# Patient Record
Sex: Male | Born: 1975 | Race: White | Hispanic: No | Marital: Married | Smoking: Former smoker
Health system: Southern US, Community
[De-identification: ages and names within clinical notes are randomized; demographics above are authoritative.]

## PROBLEM LIST (undated history)

## (undated) DIAGNOSIS — N529 Male erectile dysfunction, unspecified: Secondary | ICD-10-CM

## (undated) DIAGNOSIS — IMO0001 Reserved for inherently not codable concepts without codable children: Secondary | ICD-10-CM

## (undated) DIAGNOSIS — N400 Enlarged prostate without lower urinary tract symptoms: Secondary | ICD-10-CM

## (undated) DIAGNOSIS — R030 Elevated blood-pressure reading, without diagnosis of hypertension: Secondary | ICD-10-CM

## (undated) DIAGNOSIS — E291 Testicular hypofunction: Secondary | ICD-10-CM

## (undated) DIAGNOSIS — E663 Overweight: Secondary | ICD-10-CM

## (undated) HISTORY — PX: KNEE ARTHROSCOPY: SUR90

## (undated) HISTORY — DX: Elevated blood-pressure reading, without diagnosis of hypertension: R03.0

## (undated) HISTORY — DX: Overweight: E66.3

## (undated) HISTORY — DX: Reserved for inherently not codable concepts without codable children: IMO0001

## (undated) HISTORY — DX: Benign prostatic hyperplasia without lower urinary tract symptoms: N40.0

## (undated) HISTORY — DX: Testicular hypofunction: E29.1

## (undated) HISTORY — DX: Male erectile dysfunction, unspecified: N52.9

## (undated) HISTORY — PX: CHOLECYSTECTOMY: SHX55

## (undated) HISTORY — PX: OTHER SURGICAL HISTORY: SHX169

---

## 1999-02-13 ENCOUNTER — Emergency Department (HOSPITAL_COMMUNITY): Admission: EM | Admit: 1999-02-13 | Discharge: 1999-02-13 | Payer: Self-pay | Admitting: Emergency Medicine

## 1999-02-19 ENCOUNTER — Emergency Department (HOSPITAL_COMMUNITY): Admission: EM | Admit: 1999-02-19 | Discharge: 1999-02-19 | Payer: Self-pay | Admitting: Emergency Medicine

## 2010-01-19 ENCOUNTER — Emergency Department: Payer: Self-pay | Admitting: Emergency Medicine

## 2011-06-22 DIAGNOSIS — I82409 Acute embolism and thrombosis of unspecified deep veins of unspecified lower extremity: Secondary | ICD-10-CM

## 2011-06-22 HISTORY — DX: Acute embolism and thrombosis of unspecified deep veins of unspecified lower extremity: I82.409

## 2011-12-17 LAB — COMPREHENSIVE METABOLIC PANEL
Albumin: 4 g/dL (ref 3.4–5.0)
Alkaline Phosphatase: 51 U/L (ref 50–136)
Anion Gap: 6 — ABNORMAL LOW (ref 7–16)
BUN: 26 mg/dL — ABNORMAL HIGH (ref 7–18)
Bilirubin,Total: 0.3 mg/dL (ref 0.2–1.0)
Co2: 31 mmol/L (ref 21–32)
Creatinine: 1.64 mg/dL — ABNORMAL HIGH (ref 0.60–1.30)
EGFR (African American): >60
EGFR (Non-African Amer.): 53 — ABNORMAL LOW
Glucose: 101 mg/dL — ABNORMAL HIGH (ref 65–99)
Osmolality: 284 (ref 275–301)
SGOT(AST): 30 U/L (ref 15–37)
Sodium: 140 mmol/L (ref 136–145)
Total Protein: 7.3 g/dL (ref 6.4–8.2)

## 2011-12-17 LAB — URINALYSIS, COMPLETE
Bacteria: NONE SEEN
Bilirubin,UR: NEGATIVE
Blood: NEGATIVE
Glucose,UR: NEGATIVE mg/dL (ref 0–75)
Ketone: NEGATIVE
Leukocyte Esterase: NEGATIVE
Nitrite: NEGATIVE
Ph: 5 (ref 4.5–8.0)
RBC,UR: NONE SEEN /HPF (ref 0–5)
Specific Gravity: 1.029 (ref 1.003–1.030)
Squamous Epithelial: NONE SEEN
WBC UR: NONE SEEN /HPF (ref 0–5)

## 2011-12-17 LAB — CBC
HCT: 51 % (ref 40.0–52.0)
MCH: 30.6 pg (ref 26.0–34.0)
MCHC: 34 g/dL (ref 32.0–36.0)
Platelet: 196 10*3/uL (ref 150–440)
RBC: 5.67 10*6/uL (ref 4.40–5.90)
RDW: 12.7 % (ref 11.5–14.5)

## 2011-12-17 LAB — LIPASE, BLOOD: Lipase: 384 U/L (ref 73–393)

## 2011-12-18 ENCOUNTER — Observation Stay: Payer: Self-pay | Admitting: Surgery

## 2011-12-21 LAB — PATHOLOGY REPORT

## 2011-12-24 ENCOUNTER — Observation Stay: Payer: Self-pay | Admitting: Surgery

## 2011-12-24 LAB — BASIC METABOLIC PANEL
Chloride: 103 mmol/L (ref 98–107)
Co2: 28 mmol/L (ref 21–32)
Creatinine: 1.54 mg/dL — ABNORMAL HIGH (ref 0.60–1.30)
EGFR (Non-African Amer.): 57 — ABNORMAL LOW
Glucose: 211 mg/dL — ABNORMAL HIGH (ref 65–99)
Osmolality: 285 (ref 275–301)
Potassium: 4.1 mmol/L (ref 3.5–5.1)
Sodium: 139 mmol/L (ref 136–145)

## 2011-12-24 LAB — CBC WITH DIFFERENTIAL/PLATELET
Basophil #: 0.1 10*3/uL (ref 0.0–0.1)
Eosinophil %: 3.5 %
HCT: 43.8 % (ref 40.0–52.0)
HGB: 14.3 g/dL (ref 13.0–18.0)
Lymphocyte %: 24.9 %
Monocyte %: 10.5 %
Neutrophil #: 4.2 10*3/uL (ref 1.4–6.5)
Platelet: 167 10*3/uL (ref 150–440)
RBC: 4.67 10*6/uL (ref 4.40–5.90)
RDW: 12.4 % (ref 11.5–14.5)
WBC: 6.9 10*3/uL (ref 3.8–10.6)

## 2011-12-25 LAB — PROTIME-INR
INR: 1.2
Prothrombin Time: 15.5 secs — ABNORMAL HIGH (ref 11.5–14.7)

## 2012-01-26 ENCOUNTER — Ambulatory Visit: Payer: Self-pay | Admitting: Surgery

## 2012-11-15 ENCOUNTER — Ambulatory Visit: Payer: Self-pay

## 2014-10-13 NOTE — H&P (Signed)
History of Present Illness 1 1/2 year h/o RUQ pain associated with nausea but no vomiting. Postprandial episodes about every other week, typically last about 30 minutes each. No fever or jaundice or radiation to the pain. Last two days has hgad two episodes each lasting > 3 hours and more severe.   Past Med/Surgical Hx:  Denies:   Right Knee Arthroscopy:   ALLERGIES:  No Known Allergies:   HOME MEDICATIONS: Medication Status  Denies Active   Family and Social History:   Family History Non-Contributory    Social History negative tobacco, negative ETOH    Place of Living Home  married with kids, works as Warden/ranger   Review of Systems:   Fever/Chills No    Cough No    Sputum No    Abdominal Pain Yes    Diarrhea No    Constipation No    Nausea/Vomiting Yes    SOB/DOE No    Chest Pain No    Dysuria No    Tolerating PT Yes    Tolerating Diet No  Nauseated   Physical Exam:   GEN well developed, well nourished, no acute distress    HEENT pink conjunctivae, PERRL, hearing intact to voice, Oropharynx clear, good dentition    NECK supple    RESP normal resp effort  clear BS    CARD regular rate  no JVD  no Rub    ABD denies tenderness  normal BS    EXTR negative edema    SKIN normal to palpation, skin turgor good    NEURO follows commands, motor/sensory function intact    PSYCH A+O to time, place, person, good insight   Lab Results: Hepatic:  28-Jun-13 19:18    Bilirubin, Total 0.3   Alkaline Phosphatase 51   SGPT (ALT)  93 (12-78 NOTE: NEW REFERENCE RANGE 05/14/2011)   SGOT (AST) 30   Total Protein, Serum 7.3   Albumin, Serum 4.0  Routine Chem:  28-Jun-13 19:18    Lipase 384 (Result(s) reported on 17 Dec 2011 at 08:44PM.)   Glucose, Serum  101   BUN  26   Creatinine (comp)  1.64   Sodium, Serum 140   Potassium, Serum 4.2   Chloride, Serum 103   CO2, Serum 31   Calcium (Total), Serum 9.0   Osmolality (calc) 284    eGFR (African American) >60   eGFR (Non-African American)  53 (eGFR values <67mL/min/1.73 m2 may be an indication of chronic kidney disease (CKD). Calculated eGFR is useful in patients with stable renal function. The eGFR calculation will not be reliable in acutely ill patients when serum creatinine is changing rapidly. It is not useful in  patients on dialysis. The eGFR calculation may not be applicable to patients at the low and high extremes of body sizes, pregnant women, and vegetarians.)   Anion Gap  6  Routine UA:  28-Jun-13 19:18    Color (UA) Yellow   Clarity (UA) Hazy   Glucose (UA) Negative   Bilirubin (UA) Negative   Ketones (UA) Negative   Specific Gravity (UA) 1.029   Blood (UA) Negative   pH (UA) 5.0   Protein (UA) Negative   Nitrite (UA) Negative   Leukocyte Esterase (UA) Negative (Result(s) reported on 17 Dec 2011 at 07:49PM.)   RBC (UA) NONE SEEN   WBC (UA) NONE SEEN   Bacteria (UA) NONE SEEN   Epithelial Cells (UA) NONE SEEN   Mucous (UA) PRESENT (Result(s) reported on  17 Dec 2011 at 07:49PM.)  Routine Hem:  28-Jun-13 19:18    WBC (CBC) 7.3   RBC (CBC) 5.67   Hemoglobin (CBC) 17.3   Hematocrit (CBC) 51.0   Platelet Count (CBC) 196 (Result(s) reported on 17 Dec 2011 at 08:42PM.)   MCV 90   MCH 30.6   MCHC 34.0   RDW 12.7     Assessment/Admission Diagnosis U/S - GB wall thick, stones, pericholecystic fluid  Dehydration    Plan IVF ABx Lap CCY   Electronic Signatures: Consuela Mimes (MD)  (Signed 29-Jun-13 00:50)  Authored: CHIEF COMPLAINT and HISTORY, PAST MEDICAL/SURGIAL HISTORY, ALLERGIES, HOME MEDICATIONS, FAMILY AND SOCIAL HISTORY, REVIEW OF SYSTEMS, PHYSICAL EXAM, LABS, ASSESSMENT AND PLAN   Last Updated: 29-Jun-13 00:50 by Consuela Mimes (MD)

## 2014-10-13 NOTE — Op Note (Signed)
PATIENT NAME:  James Hebert, James Hebert MR#:  [id number] DATE OF BIRTH:  06-Jun-1976  DATE OF PROCEDURE:  12/19/2011  PREOPERATIVE DIAGNOSIS: Acute calculus cholecystitis.   POSTOPERATIVE DIAGNOSIS: Acute calculus cholecystitis.  PROCEDURE PERFORMED: Laparoscopic cholecystectomy.   SURGEON: Raynald KempMark A Billyjack Trompeter, M.D.   ASSISTANT: Surgical scrub technologist x2.  TYPE OF ANESTHESIA: General endotracheal, [facility].   INDICATIONS: This is an obese 39 year old white male with a 1-1/2 year history of intermittent biliary colic. Over the last two days, the patient has had persistent right upper quadrant and epigastric abdominal pain. Liver function tests were normal. Gallbladder ultrasound demonstrated pericholecystic fluid, gallbladder wall of 4 mm, and stones. The patient was admitted to the hospital, hydrated, and started on intravenous antibiotics. Laparoscopic cholecystectomy was entertained with the family and they agreed. I discussed with them in the holding area the risks of surgery including that of bleeding, infection, bile duct injury, leak, need for conversion to open operation, and future ERCP. All their questions were answered.   FINDINGS: Hydrops and stones.   SPECIMENS: Stones and gallbladder.   DESCRIPTION OF PROCEDURE: With the patient in the supine position, general oral endotracheal anesthesia was induced. The patient's abdomen was clipped of hair, prepped and draped with ChloraPrep solution. Time out was observed. An infraumbilical transversely oriented skin incision was fashioned with a scalpel and carried down with sharp dissection to the fascia. The fascia was incised in the midline and elevated with Kocher clamps. The peritoneum was entered bluntly. An 0 Vicryl U-stitch was passed on either side of the abdominal midline fascia securing a 12 mm blunt Hassan trocar placed under direct visualization. Pneumoperitoneum was established. The patient had acute cholecystitis. The  patient was then positioned in reverse Trendelenburg and airplane right side up. A 5 mm Bladeless trocar was placed through a small incision in the epigastric region. Two 5 mm ports were placed in the right subcostal margin. Utilizing a needle aspiration cannula, the gallbladder was decompressed of clear bile. A grasper was placed on the fundus of the gallbladder and elevated towards the right shoulder. Due to the patient's obesity, an additional 5 mm trocar was placed in the right midabdomen through which the peer retractor was used to retract omentum and colon inferiorly. Lateral traction was placed on Hartmann's pouch. The hepatoduodenal ligament was then incised with blunt technique and point cautery at bleeding points on the peritoneum. A critical view of safety cholecystectomy was performed with clear identification of the gallbladder cystic duct junction. A single cystic artery was identified with a small posterior branch. Three 10 mm clips were placed on the portal side of the cystic duct, one clip on the gallbladder side, and the structure was then divided. Immediately adjacent the cystic artery was identified and similarly ligated. The gallbladder was then retrieved off the gallbladder fossa utilizing hook cautery apparatus and countertraction. It was placed into an Endo Catch device and retrieved. The right upper quadrant was irrigated with a total of 2 liters of normal saline during the operation and aspirated dry. Point hemostasis in the gallbladder fossa was obtained with point cautery and the application of Endo Avitene. With hemostasis being ensured on the operative field, ports were then removed under direct visualization with hemostasis being confirmed and the infraumbilical fascial defect was closed with multiple simple and figure-of-eight 0 Vicryl sutures in vertical orientation. The existing stay sutures were tied to each other. A total of 30 mL of 0.25% plain Marcaine was infiltrated along all  skin and fascial incisions prior to closure.   4-0 Vicryl subcuticular was used to reapproximate skin edges followed by the application of benzoin, Steri-Strips, Telfa, and Tegaderm. The patient then subsequently taken to the recovery room in stable and satisfactory condition by anesthesia services.  ____________________________ Redge Gainer Egbert Garibaldi, MD mab:slb D: 12/19/2011 11:31:51 ET T: 12/19/2011 12:27:21 ET JOB#: [id number]  cc: Loraine Leriche A. Egbert Garibaldi, MD, <Dictator> Raynald Kemp MD ELECTRONICALLY SIGNED 12/21/2011 9:48

## 2014-10-13 NOTE — H&P (Signed)
PATIENT NAME:  James Hebert, [name] MR#:  [id number] DATE OF BIRTH:  [birth year]  DATE OF ADMISSION:  12/24/2011  ADMITTING PHYSICIAN: Dr. Michela PitcherEly  CHIEF COMPLAINT: Left leg pain.   BRIEF HISTORY: Mr. James Hebert is a 39 year old gentleman five days status post laparoscopic cholecystectomy for acute cholecystitis. He had an uncomplicated hospitalization. He noted immediately following surgery that he had some left calf discomfort. He was combative during Emergency Room anesthesia but did not have any documented trauma. The calf continued to bother him. Over the last several days has become increasingly uncomfortable, particularly at rest. He denies any swelling. Called the office today and ultrasound of his left lower extremity was arranged. He did have a posterior tibial deep vein thrombosis all of which appear to be infrapopliteal. Because of his symptoms and the possibility of postsurgical thrombosis he was admitted to the hospital for anticoagulation and further evaluation.   FAMILY HISTORY: Noncontributory.   SOCIAL HISTORY: He does not smoke cigarettes or drink alcohol. He lives with his wife. Works as an Dance movement psychotherapistelevator inspector.   REVIEW OF SYSTEMS: Unremarkable, well outlined in his previous admission note.   PHYSICAL EXAMINATION:  GENERAL: He is an alert, pleasant gentleman, no significant distress.   VITAL SIGNS: His vital signs are stable with blood pressure 148/78, heart rate 84 and regular, oxygen saturation 96% on room air.   HEENT: No scleral icterus and normal pupillary reflexes. He does not have any facial deformity.   NECK: Supple without adenopathy.   CHEST: Clear with no adventitious sounds. He has normal pulmonary excursion.   CARDIAC: No murmurs or gallops to my ear. He seems to be in normal sinus rhythm.   ABDOMEN: Benign with good well healed scars. He has some mild bruising. He has no rebound or guarding and has no abdominal distention.   EXTREMITIES: Lower extremity exam  reveals no swelling but some obvious deep tenderness in the left calf. There is no bruising. He has a negative Homan's sign. He has good distal pulses.   PSYCHIATRIC: Psychiatric exam reveals no depression and no orientation problems.   IMPRESSION: This gentleman appears to have a deep vein thrombosis of his left lower extremity. All of the clot appears to be infrapopliteal. In some situations I would not recommend anticoagulation but since he is postop it does appear to be significantly symptomatic and does have evidence of multiple clot. We will go on an anticoagulate him. I did discuss the possibility of a filter with the vascular surgery service. They felt with infrapopliteal clot that a filter was not necessary. Will plan to anticoagulate him. Follow him as an outpatient. This plan has been discussed with the patient and his wife and they are in agreement.    ____________________________ Carmie Endalph L. Ely III, MD rle:cms D: 12/24/2011 16:57:37 ET T: 12/24/2011 17:24:18 ET JOB#: [id number]  cc: Quentin Orealph L. Ely III, MD, <Dictator> Quentin OreALPH L ELY MD ELECTRONICALLY SIGNED 12/25/2011 9:55

## 2014-10-13 NOTE — Discharge Summary (Signed)
PATIENT NAME:  James Hebert, James Hebert MR#:  [id number] DATE OF BIRTH:  1976-01-05  DATE OF ADMISSION:  12/24/2011 DATE OF DISCHARGE:  12/25/2011  BRIEF HISTORY: [name] is a 39 year old gentleman five days status post laparoscopic cholecystectomy for acute cholecystitis. Immediately following surgery he noted onset of left calf pain which had increased over several days at home. The pain intensified to the point that he sought attention. He presented to the office for further evaluation. There was no significant swelling but there was point tenderness and negative Homans sign. Lower extremity Doppler venous study was performed which did demonstrate some infrapopliteal clot in the posterior tibial vein. Because of his symptoms and the fact that the clot appeared to be mobile he was admitted to the hospital and placed on anticoagulation. I discussed anticoagulation options with the vascular surgeons and we elected to proceed with Xarelto. He had one dose of Lovenox and placed on Xarelto 15 mg p.o. b.i.d. with meals, discussed with the pharmacist. They concurred with the dosage. This morning he is up, feeling better with less discomfort and the pain well controlled with narcotics. Will discharge him home today to be followed in the office later this week to be certain his symptoms are improving. We anticipate a 21 day course of Xarelto 15 mg p.o. b.i.d. followed by 20 mg p.o. daily for another 21 days. This plan has been discussed with the patient and his family and they are in agreement. He is also discharged home on Vicodin 5/325, 1 to 2 every six hours p.r.Hebert. pain.   FINAL DISCHARGE DIAGNOSIS: Deep venous thrombosis left lower extremity.  ____________________________ Quentin Orealph L. Ely III, MD rle:cms D: 12/25/2011 14:52:15 ET T: 12/27/2011 12:29:10 ET JOB#: [id number]  cc: Quentin Orealph L. Ely III, MD, <Dictator>  Quentin OreALPH L ELY MD ELECTRONICALLY SIGNED 12/28/2011 16:00

## 2014-11-15 DIAGNOSIS — E349 Endocrine disorder, unspecified: Secondary | ICD-10-CM | POA: Insufficient documentation

## 2014-12-02 ENCOUNTER — Ambulatory Visit (INDEPENDENT_AMBULATORY_CARE_PROVIDER_SITE_OTHER): Payer: BC Managed Care – PPO | Admitting: *Deleted

## 2014-12-02 DIAGNOSIS — E291 Testicular hypofunction: Secondary | ICD-10-CM

## 2014-12-02 MED ORDER — TESTOSTERONE CYPIONATE 200 MG/ML IM SOLN
200.0000 mg | Freq: Once | INTRAMUSCULAR | Status: AC
  Administered 2014-12-02: 200 mg via INTRAMUSCULAR

## 2014-12-02 NOTE — Progress Notes (Signed)
Testosterone IM Injection  Due to Hypogonadism patient is present today for a Testosterone Injection.  Medication: Testosterone Cypionate Dose: 77mL Location: right upper outer buttocks Lot: [id number] Exp:06/2016  Patient tolerated well, no complications were noted  Preformed by: Rupert Stacks, LPN  Follow up: 1 week

## 2014-12-09 ENCOUNTER — Ambulatory Visit (INDEPENDENT_AMBULATORY_CARE_PROVIDER_SITE_OTHER): Payer: BC Managed Care – PPO

## 2014-12-09 DIAGNOSIS — E291 Testicular hypofunction: Secondary | ICD-10-CM

## 2014-12-09 MED ORDER — TESTOSTERONE CYPIONATE 200 MG/ML IM SOLN
200.0000 mg | Freq: Once | INTRAMUSCULAR | Status: AC
  Administered 2014-12-16: 200 mg via INTRAMUSCULAR

## 2014-12-09 NOTE — Progress Notes (Signed)
Testosterone IM Injection  Due to Hypogonadism patient is present today for a Testosterone Injection.  Medication: Testosterone Cypionate Dose: 1mL Location: left upper outer buttocks Lot: [id number] Exp:06/2016  Patient tolerated well, no complications were noted  Preformed by: Chelsea Watkins, LPN  Follow up: 1 week 

## 2014-12-16 ENCOUNTER — Ambulatory Visit (INDEPENDENT_AMBULATORY_CARE_PROVIDER_SITE_OTHER): Payer: BC Managed Care – PPO

## 2014-12-16 ENCOUNTER — Encounter (INDEPENDENT_AMBULATORY_CARE_PROVIDER_SITE_OTHER): Payer: Self-pay

## 2014-12-16 DIAGNOSIS — E291 Testicular hypofunction: Secondary | ICD-10-CM | POA: Diagnosis not present

## 2014-12-16 MED ORDER — TESTOSTERONE CYPIONATE 200 MG/ML IM SOLN
200.0000 mg | Freq: Once | INTRAMUSCULAR | Status: AC
  Administered 2014-12-16: 200 mg via INTRAMUSCULAR

## 2014-12-16 MED ORDER — TESTOSTERONE CYPIONATE 100 MG/ML IM SOLN: 100.0000 mg | Freq: Once | INTRAMUSCULAR | Status: DC

## 2014-12-16 NOTE — Progress Notes (Signed)
Testosterone IM Injection  Due to Hypogonadism patient is present today for a Testosterone Injection.  Medication: Testosterone Cypionate Dose: 1mL Location: left upper outer buttocks Lot: [id number] Exp:06/2016  Patient tolerated well, no complications were noted  Preformed by: Chelsea Watkins, LPN  Follow up: 1 week 

## 2014-12-24 ENCOUNTER — Ambulatory Visit (INDEPENDENT_AMBULATORY_CARE_PROVIDER_SITE_OTHER): Payer: BC Managed Care – PPO

## 2014-12-24 DIAGNOSIS — E291 Testicular hypofunction: Secondary | ICD-10-CM

## 2014-12-24 MED ORDER — TESTOSTERONE CYPIONATE 200 MG/ML IM SOLN
200.0000 mg | Freq: Once | INTRAMUSCULAR | Status: AC
  Administered 2014-12-24: 200 mg via INTRAMUSCULAR

## 2014-12-24 NOTE — Progress Notes (Signed)
Testosterone IM Injection  Due to Hypogonadism patient is present today for a Testosterone Injection.  Medication: Testosterone Cypionate Dose: 1mL Location: right upper outer buttocks Lot: [id number] Exp:06/2016  Patient tolerated well, no complications were noted  Preformed by: Rupert Stackshelsea Watkins, LPN  Follow up: 1 week

## 2014-12-31 ENCOUNTER — Ambulatory Visit: Payer: BC Managed Care – PPO

## 2014-12-31 DIAGNOSIS — E349 Endocrine disorder, unspecified: Secondary | ICD-10-CM

## 2014-12-31 MED ORDER — TESTOSTERONE CYPIONATE 200 MG/ML IM SOLN
200.0000 mg | Freq: Once | INTRAMUSCULAR | Status: AC
Start: 2014-12-31 — End: 2014-12-31
  Administered 2014-12-31: 200 mg via INTRAMUSCULAR

## 2014-12-31 NOTE — Progress Notes (Unsigned)
Testosterone IM Injection  Due to Hypogonadism patient is present today for a Testosterone Injection.  Medication: Testosterone Cypionate Dose:1ML Location: left upper outer buttocks Lot: [id number]  Exp:06/2016  Patient tolerated well, no complications were noted  Preformed by: K.Russell, CMA  Follow up: office visit w/ Carollee HerterShannon for DRE per Carollee HerterShannon message on 11/13/14, pt had blood work done today (Testosterone, PSA, and HCT).

## 2015-01-01 LAB — PSA: Prostate Specific Ag, Serum: 0.5 ng/mL (ref 0.0–4.0)

## 2015-01-01 LAB — TESTOSTERONE: Testosterone: 880 ng/dL (ref 348–1197)

## 2015-01-01 LAB — HEMATOCRIT: Hematocrit: 51.9 % — ABNORMAL HIGH (ref 37.5–51.0)

## 2015-01-06 ENCOUNTER — Telehealth: Payer: Self-pay

## 2015-01-06 NOTE — Telephone Encounter (Signed)
LMOM

## 2015-01-06 NOTE — Telephone Encounter (Signed)
-----   Message from Harle BattiestShannon A McGowan, PA-C sent at 01/05/2015  9:34 AM EDT ----- Patient needs an appointment.

## 2015-01-07 ENCOUNTER — Ambulatory Visit (INDEPENDENT_AMBULATORY_CARE_PROVIDER_SITE_OTHER): Payer: BC Managed Care – PPO

## 2015-01-07 DIAGNOSIS — E291 Testicular hypofunction: Secondary | ICD-10-CM

## 2015-01-07 MED ORDER — TESTOSTERONE CYPIONATE 200 MG/ML IM SOLN
200.0000 mg | Freq: Once | INTRAMUSCULAR | Status: AC
  Administered 2015-01-07: 200 mg via INTRAMUSCULAR

## 2015-01-07 NOTE — Telephone Encounter (Signed)
Pt came in today for injection. Made aware of needing appt. Pt voiced understanding.

## 2015-01-07 NOTE — Progress Notes (Signed)
Testosterone IM Injection  Due to Hypogonadism patient is present today for a Testosterone Injection.  Medication: Testosterone Cypionate Dose: 1mL Location: left upper outer buttocks Lot: [id number] Exp:06/2016  Patient tolerated well, no complications were noted  Preformed by: Rupert Stackshelsea Mussa Groesbeck, LPN  Follow up: 1 week

## 2015-01-13 ENCOUNTER — Telehealth: Payer: Self-pay

## 2015-01-13 ENCOUNTER — Ambulatory Visit (INDEPENDENT_AMBULATORY_CARE_PROVIDER_SITE_OTHER): Payer: BC Managed Care – PPO

## 2015-01-13 DIAGNOSIS — E349 Endocrine disorder, unspecified: Secondary | ICD-10-CM

## 2015-01-13 DIAGNOSIS — E291 Testicular hypofunction: Secondary | ICD-10-CM | POA: Diagnosis not present

## 2015-01-13 MED ORDER — TESTOSTERONE CYPIONATE 200 MG/ML IM SOLN
200.0000 mg | Freq: Once | INTRAMUSCULAR | Status: AC
  Administered 2015-01-13: 200 mg via INTRAMUSCULAR

## 2015-01-13 NOTE — Telephone Encounter (Signed)
Pt came for testosterone injection this a.m. And requested a rx of generic viagra. Please advise.

## 2015-01-13 NOTE — Progress Notes (Signed)
Testosterone IM Injection  Due to Hypogonadism patient is present today for a Testosterone Injection.  Medication: Testosterone Cypionate Dose: 1mL Location: left upper outer buttocks Lot: [id number] Exp:06/2016  Patient tolerated well, no complications were noted  Preformed by: Islay Polanco, LPN  Follow up: 1 week 

## 2015-01-13 NOTE — Telephone Encounter (Signed)
Patient is actually due to see.  He needs an appointment with me and I can address this at that appointment.

## 2015-01-13 NOTE — Telephone Encounter (Signed)
Spoke with pt in reference to medication. Made pt aware he needed an appt. Pt voiced understanding stating he has an appt at the end of August to discuss other urological issues.

## 2015-01-14 ENCOUNTER — Ambulatory Visit: Payer: BC Managed Care – PPO

## 2015-01-20 ENCOUNTER — Ambulatory Visit: Payer: BC Managed Care – PPO

## 2015-01-27 ENCOUNTER — Ambulatory Visit (INDEPENDENT_AMBULATORY_CARE_PROVIDER_SITE_OTHER): Payer: BC Managed Care – PPO | Admitting: *Deleted

## 2015-01-27 DIAGNOSIS — E291 Testicular hypofunction: Secondary | ICD-10-CM | POA: Diagnosis not present

## 2015-01-27 MED ORDER — TESTOSTERONE CYPIONATE 200 MG/ML IM SOLN
200.0000 mg | Freq: Once | INTRAMUSCULAR | Status: AC
  Administered 2015-01-27: 200 mg via INTRAMUSCULAR

## 2015-01-27 NOTE — Progress Notes (Signed)
Testosterone IM Injection  Due to Hypogonadism patient is present today for a Testosterone Injection.  Medication: Testosterone Cypionate Dose: 1 ml Location: left upper outer buttocks Lot: [id number] Exp:06/2016  Patient tolerated well, no complications were noted  Preformed by: Natividad Brood, CMA   Follow up: 1 week

## 2015-02-03 ENCOUNTER — Ambulatory Visit (INDEPENDENT_AMBULATORY_CARE_PROVIDER_SITE_OTHER): Payer: BC Managed Care – PPO

## 2015-02-03 DIAGNOSIS — E291 Testicular hypofunction: Secondary | ICD-10-CM | POA: Diagnosis not present

## 2015-02-03 DIAGNOSIS — E349 Endocrine disorder, unspecified: Secondary | ICD-10-CM

## 2015-02-03 MED ORDER — TESTOSTERONE CYPIONATE 200 MG/ML IM SOLN
200.0000 mg | Freq: Once | INTRAMUSCULAR | Status: AC
  Administered 2015-02-03: 200 mg via INTRAMUSCULAR

## 2015-02-03 NOTE — Progress Notes (Signed)
Testosterone IM Injection  Due to Hypogonadism patient is present today for a Testosterone Injection.  Medication: Testosterone Cypionate Dose: 1mL Location: right upper outer buttocks Lot: [id number] Exp:06/2016  Patient tolerated well, no complications were noted  Preformed by: Chelsea Watkins, LPN  Follow up: 1 week 

## 2015-02-10 ENCOUNTER — Ambulatory Visit: Payer: BC Managed Care – PPO

## 2015-02-11 ENCOUNTER — Encounter: Payer: Self-pay | Admitting: *Deleted

## 2015-02-19 ENCOUNTER — Ambulatory Visit: Payer: BC Managed Care – PPO | Admitting: Urology

## 2015-09-05 ENCOUNTER — Ambulatory Visit (INDEPENDENT_AMBULATORY_CARE_PROVIDER_SITE_OTHER): Payer: BLUE CROSS/BLUE SHIELD | Admitting: Urology

## 2015-09-05 ENCOUNTER — Encounter: Payer: Self-pay | Admitting: Urology

## 2015-09-05 VITALS — BP 154/95 | HR 60 | Temp 98.4°F | Resp 16 | Ht 70.0 in | Wt 256.5 lb

## 2015-09-05 DIAGNOSIS — N400 Enlarged prostate without lower urinary tract symptoms: Secondary | ICD-10-CM | POA: Diagnosis not present

## 2015-09-05 DIAGNOSIS — E291 Testicular hypofunction: Secondary | ICD-10-CM

## 2015-09-05 DIAGNOSIS — N529 Male erectile dysfunction, unspecified: Secondary | ICD-10-CM

## 2015-09-05 DIAGNOSIS — N401 Enlarged prostate with lower urinary tract symptoms: Secondary | ICD-10-CM | POA: Diagnosis not present

## 2015-09-05 DIAGNOSIS — N138 Other obstructive and reflux uropathy: Secondary | ICD-10-CM

## 2015-09-05 DIAGNOSIS — N528 Other male erectile dysfunction: Secondary | ICD-10-CM | POA: Diagnosis not present

## 2015-09-05 MED ORDER — SILDENAFIL CITRATE 20 MG PO TABS: ORAL_TABLET | ORAL | Status: DC

## 2015-09-05 MED ORDER — TESTOSTERONE CYPIONATE 200 MG/ML IM SOLN: 200.0000 mg | INTRAMUSCULAR | Status: DC

## 2015-09-05 MED ORDER — TESTOSTERONE CYPIONATE 200 MG/ML IM SOLN
200.0000 mg | Freq: Once | INTRAMUSCULAR | Status: AC
  Administered 2015-09-05: 200 mg via INTRAMUSCULAR

## 2015-09-05 NOTE — Progress Notes (Signed)
Testosterone IM Injection  Due to Hypogonadism patient is present today for a Testosterone Injection.  Medication: Testosterone Cypionate Dose: 1mL Location: right upper outer buttocks Lot: [id number] Exp:06/2016  Patient tolerated well, no complications were noted  Preformed by: Rupert Stackshelsea Watkins, LPN

## 2015-09-05 NOTE — Progress Notes (Signed)
09/05/2015 10:13 AM   James Hebert [birth year] [id number]  Referring provider: Ignacia Bayleyobert Tumey, PA-C [facility] [facility] [address] [facility] [place], [place] [id number]  Chief Complaint  Patient presents with  . Benign Prostatic Hypertrophy  . Erectile Dysfunction  . Follow-up    HPI: Patient is a 40 year old Caucasian male with hypogonadism, erectile dysfunction and BPH with LUTS who presents today for 6 month follow-up.  Hypogonadism Patient is experiencing a lack of energy, a decrease in strength, sadness and/or grumpiness, erections being less strong, a recent deterioration in an ability to play sports and falling asleep after dinner.  This is indicated by his responses to the ADAM questionnaire.   He is currently managing his hypogonadism with testosterone cypionate injections, 200 mg/mL  1 mL weekly.  He is no consistent with the injections due to scheduling conflicts and financial concerns.        Androgen Deficiency in the Aging Male      09/05/15 0900       Androgen Deficiency in the Aging Male   Do you have a decrease in libido (sex drive) No     Do you have lack of energy Yes     Do you have a decrease in strength and/or endurance Yes     Have you lost height No     Have you noticed a decreased "enjoyment of life" No     Are you sad and/or grumpy No     Are your erections less strong Yes     Have you noticed a recent deterioration in your ability to play sports Yes     Are you falling asleep after dinner Yes     Has there been a recent deterioration in your work performance No       Erectile dysfunction His SHIM score is 20, which is mild erectile dysfunction.   He has been having difficulty with erections for over 5 years.   His major complaint is achieving an erection.  His libido is preserved.   His risk factors for ED are age, BPH, hypogonadism, HTN and increased BMI .  He denies any painful erections or curvatures with his erections.   He has  tried sildenafil in the past with good success.      SHIM      09/05/15 0950       SHIM: Over the last 6 months:   How do you rate your confidence that you could get and keep an erection? Moderate     When you had erections with sexual stimulation, how often were your erections hard enough for penetration (entering your partner)? Almost Always or Always     During sexual intercourse, how often were you able to maintain your erection after you had penetrated (entered) your partner? Slightly Difficult     During sexual intercourse, how difficult was it to maintain your erection to completion of intercourse? Not Difficult     When you attempted sexual intercourse, how often was it satisfactory for you? Difficult     SHIM Total Score   SHIM 20        Score: 1-7 Severe ED 8-11 Moderate ED 12-16 Mild-Moderate ED 17-21 Mild ED 22-25 No ED   BPH WITH LUTS His IPSS score today is 6, which is mild lower urinary tract symptomatology. He is mostly satisfied with his quality life due to his urinary symptoms.  He denies any dysuria, hematuria or suprapubic pain.  He also denies any  recent fevers, chills, nausea or vomiting.  He does not have a family history of PCa.     IPSS      09/05/15 0900       International Prostate Symptom Score   How often have you had the sensation of not emptying your bladder? More than half the time     How often have you had to urinate less than every two hours? Less than half the time     How often have you found you stopped and started again several times when you urinated? Not at All     How often have you found it difficult to postpone urination? Not at All     How often have you had a weak urinary stream? Not at All     How often have you had to strain to start urination? Not at All     How many times did you typically get up at night to urinate? None     Total IPSS Score 6     Quality of Life due to urinary symptoms   If you were to spend the rest of  your life with your urinary condition just the way it is now how would you feel about that? Mostly Satisfied        Score:  1-7 Mild 8-19 Moderate 20-35 Severe    PMH: Past Medical History  Diagnosis Date  . Hypogonadism in male   . Erectile dysfunction   . Over weight   . Elevated blood pressure   . Benign enlargement of prostate     Surgical History: Past Surgical History  Procedure Laterality Date  . Thrombosis of leg    . Cholecystectomy      Home Medications:    Medication List       This list is accurate as of: 09/05/15 10:13 AM.  Always use your most recent med list.               HYDROcodone-acetaminophen 5-325 MG tablet  Commonly known as:  NORCO/VICODIN  Take by mouth.     predniSONE 10 MG tablet  Commonly known as:  DELTASONE  Take by mouth.     sildenafil 20 MG tablet  Commonly known as:  REVATIO  Take 3 to 5 tablets two hours before intercouse on an empty stomach.  Do not take with nitrates.     testosterone cypionate 200 MG/ML injection  Commonly known as:  DEPOTESTOSTERONE CYPIONATE  Inject 200 mg into the muscle every 14 (fourteen) days. Reported on 09/05/2015        Allergies: No Known Allergies  Family History: Family History  Problem Relation Age of Onset  . Prostate cancer Neg Hx   . Bladder Cancer Neg Hx     Social History:  reports that he has quit smoking. He does not have any smokeless tobacco history on file. He reports that he drinks alcohol. His drug history is not on file.  ROS: UROLOGY Frequent Urination?: No Hard to postpone urination?: No Burning/pain with urination?: No Get up at night to urinate?: No Leakage of urine?: No Urine stream starts and stops?: No Trouble starting stream?: No Do you have to strain to urinate?: No Blood in urine?: No Urinary tract infection?: No Sexually transmitted disease?: No Injury to kidneys or bladder?: No Painful intercourse?: No Weak stream?: No Erection problems?:  No Penile pain?: No  Gastrointestinal Nausea?: No Vomiting?: No Indigestion/heartburn?: No Diarrhea?: No Constipation?: No  Constitutional Fever: No  Night sweats?: No Weight loss?: No Fatigue?: No  Skin Skin rash/lesions?: No Itching?: No  Eyes Blurred vision?: No Double vision?: No  Ears/Nose/Throat Sore throat?: No Sinus problems?: No  Hematologic/Lymphatic Swollen glands?: No Easy bruising?: No  Cardiovascular Leg swelling?: No Chest pain?: No  Respiratory Cough?: No Shortness of breath?: No  Endocrine Excessive thirst?: No  Musculoskeletal Back pain?: No Joint pain?: No  Neurological Headaches?: No Dizziness?: No  Psychologic Depression?: No Anxiety?: No  Physical Exam: BP 154/95 mmHg  Pulse 60  Temp(Src) 98.4 F (36.9 C) (Oral)  Resp 16  Ht  (1.778 m)  Wt 256 lb 8 oz (116.348 kg)  BMI 36.80 kg/m2  SpO2 97%  Constitutional: Well nourished. Alert and oriented, No acute distress. HEENT: [place] AT, moist mucus membranes. Trachea midline, no masses. Cardiovascular: No clubbing, cyanosis, or edema. Respiratory: Normal respiratory effort, no increased work of breathing. GI: Abdomen is soft, non tender, non distended, no abdominal masses. Liver and spleen not palpable.  No hernias appreciated.  Stool sample for occult testing is not indicated.   GU: No CVA tenderness.  No bladder fullness or masses.  Patient with circumcised phallus. Foreskin easily retracted  Urethral meatus is patent.  No penile discharge. No penile lesions or rashes. Scrotum without lesions, cysts, rashes and/or edema.  Testicles are located scrotally bilaterally. No masses are appreciated in the testicles. Left and right epididymis are normal. Rectal: Patient with  normal sphincter tone. Anus and perineum without scarring or rashes. No rectal masses are appreciated. Prostate is approximately 45 grams, no nodules are appreciated. Seminal vesicles are normal. Skin: No rashes,  bruises or suspicious lesions. Lymph: No cervical or inguinal adenopathy. Neurologic: Grossly intact, no focal deficits, moving all 4 extremities. Psychiatric: Normal mood and affect.  Laboratory Data: Lab Results  Component Value Date   WBC 6.9 12/24/2011   HGB 14.3 12/24/2011   HCT 51.9* 12/31/2014   MCV 94 12/24/2011   PLT 167 12/24/2011   Lab Results  Component Value Date   CREATININE 1.54* 12/24/2011   PSA History  0.5 ng/mL on 12/31/2014  Lab Results  Component Value Date   TESTOSTERONE 880 12/31/2014   Lab Results  Component Value Date   AST 30 12/17/2011   Lab Results  Component Value Date   ALT 93* 12/17/2011    Assessment & Plan:    1. Hypogonadism:   Patient is doing well with the injections when he can get to the office.  He is not wanting to change modalities at this time.  He is not a candidate for Clomid due to elevated LFT's.  He will continue the weekly injections of testosterone cypionate,  IM weekly.  HCT and serum testosterone in three months.   2. Erectile dysfunction:   SHIM score is 20.  He is having success with the sildenafil.  He will continue that medication.  We will see him in 6 months for a SHIM and exam.    3. BPH with LUTS:   IPSS score is 6/2.  We will continue to monitor as testosterone therapy can worsen LUTS.    He will have his IPSS score, exam and PSA in 6 months.  - PSA   Return in about 1 week (around 09/12/2015) for testosterone injection.  These notes generated with voice recognition software. I apologize for typographical errors.  Michiel Cowboy, PA-C  [facility] [address] [place], [place] [address] [phone]

## 2015-09-06 LAB — PSA: Prostate Specific Ag, Serum: 0.3 ng/mL (ref 0.0–4.0)

## 2015-09-08 ENCOUNTER — Telehealth: Payer: Self-pay

## 2015-09-08 NOTE — Telephone Encounter (Signed)
Spoke with [email or web address]  to have labs added on, they will fax us a request for add on to sign if there is enough blood to preform all tests.    PSA is stable. We will repeat it in 6 months with his office visit. Patient notified on [email or web address]

## 2015-09-08 NOTE — Telephone Encounter (Signed)
-----   Message from Harle BattiestShannon A McGowan, PA-C sent at 09/07/2015  9:00 PM EDT ----- We need to add an estradiol, TSH, LFT's and lipids to his blood work.

## 2015-09-10 DIAGNOSIS — N529 Male erectile dysfunction, unspecified: Secondary | ICD-10-CM | POA: Insufficient documentation

## 2015-09-10 DIAGNOSIS — N401 Enlarged prostate with lower urinary tract symptoms: Secondary | ICD-10-CM | POA: Insufficient documentation

## 2015-09-10 DIAGNOSIS — N138 Other obstructive and reflux uropathy: Secondary | ICD-10-CM | POA: Insufficient documentation

## 2015-09-10 DIAGNOSIS — E291 Testicular hypofunction: Secondary | ICD-10-CM | POA: Insufficient documentation

## 2015-09-12 ENCOUNTER — Ambulatory Visit (INDEPENDENT_AMBULATORY_CARE_PROVIDER_SITE_OTHER): Payer: BLUE CROSS/BLUE SHIELD

## 2015-09-12 DIAGNOSIS — E291 Testicular hypofunction: Secondary | ICD-10-CM

## 2015-09-12 MED ORDER — TESTOSTERONE CYPIONATE 200 MG/ML IM SOLN
200.0000 mg | Freq: Once | INTRAMUSCULAR | Status: AC
  Administered 2015-09-12: 200 mg via INTRAMUSCULAR

## 2015-09-12 NOTE — Progress Notes (Signed)
Testosterone IM Injection  Due to Hypogonadism patient is present today for a Testosterone Injection.  Medication: Testosterone Cypionate Dose: 1mL Location: left upper outer buttocks Lot: [id number] Exp:03/2017  Patient tolerated well, no complications were noted  Preformed by: Rupert Stackshelsea Watkins, LPN   Follow up: 1 week

## 2015-09-16 ENCOUNTER — Telehealth: Payer: Self-pay

## 2015-09-16 NOTE — Telephone Encounter (Signed)
PA for testosterone cypionate has been APPROVED for 08/13/15-09/11/16. Case ID #[id number]

## 2015-09-19 ENCOUNTER — Ambulatory Visit (INDEPENDENT_AMBULATORY_CARE_PROVIDER_SITE_OTHER): Payer: BLUE CROSS/BLUE SHIELD

## 2015-09-19 DIAGNOSIS — E291 Testicular hypofunction: Secondary | ICD-10-CM | POA: Diagnosis not present

## 2015-09-19 DIAGNOSIS — N529 Male erectile dysfunction, unspecified: Secondary | ICD-10-CM

## 2015-09-19 MED ORDER — TESTOSTERONE CYPIONATE 200 MG/ML IM SOLN
200.0000 mg | Freq: Once | INTRAMUSCULAR | Status: AC
  Administered 2015-09-19: 200 mg via INTRAMUSCULAR

## 2015-09-19 MED ORDER — SILDENAFIL CITRATE 20 MG PO TABS: ORAL_TABLET | ORAL | Status: DC

## 2015-09-19 NOTE — Progress Notes (Signed)
Testosterone IM Injection  Due to Hypogonadism patient is present today for a Testosterone Injection.  Medication: Testosterone Cypionate Dose: 1mL Location: right upper outer buttocks Lot: [id number] Exp:03/2017  Patient tolerated well, no complications were noted  Preformed by: Rupert Stackshelsea Watkins, LPN   Follow up:1 week

## 2015-09-23 ENCOUNTER — Telehealth: Payer: Self-pay

## 2015-09-23 DIAGNOSIS — E291 Testicular hypofunction: Secondary | ICD-10-CM

## 2015-09-23 NOTE — Telephone Encounter (Signed)
Give him a new script.

## 2015-09-23 NOTE — Telephone Encounter (Signed)
Pt called stating that he is coming to the office for injections and he needs a new script. Per pt the old script was written for 200mg  every 2 weeks and therefore he does not have enough medication. Please advise.

## 2015-09-24 MED ORDER — TESTOSTERONE CYPIONATE 200 MG/ML IM SOLN: INTRAMUSCULAR | Status: DC

## 2015-09-24 NOTE — Telephone Encounter (Signed)
New script was faxed to pt pharmacy.

## 2015-09-30 LAB — SPECIMEN STATUS REPORT

## 2015-09-30 LAB — HEPATIC FUNCTION PANEL
ALT: 85 IU/L — AB (ref 0–44)
AST: 26 IU/L (ref 0–40)
Albumin: 4.3 g/dL (ref 3.5–5.5)
Alkaline Phosphatase: 60 IU/L (ref 39–117)
BILIRUBIN TOTAL: 0.6 mg/dL (ref 0.0–1.2)
BILIRUBIN, DIRECT: 0.15 mg/dL (ref 0.00–0.40)
Total Protein: 6.5 g/dL (ref 6.0–8.5)

## 2015-09-30 LAB — LIPID PANEL W/O CHOL/HDL RATIO
CHOLESTEROL TOTAL: 162 mg/dL (ref 100–199)
HDL: 55 mg/dL (ref >39)
LDL Calculated: 81 mg/dL (ref 0–99)
Triglycerides: 131 mg/dL (ref 0–149)
VLDL Cholesterol Cal: 26 mg/dL (ref 5–40)

## 2015-09-30 LAB — TSH: TSH: 2.31 u[IU]/mL (ref 0.450–4.500)

## 2015-09-30 LAB — ESTRADIOL: Estradiol: 8.9 pg/mL (ref 7.6–42.6)

## 2016-02-19 ENCOUNTER — Other Ambulatory Visit: Payer: Self-pay | Admitting: Urology

## 2016-02-19 DIAGNOSIS — E291 Testicular hypofunction: Secondary | ICD-10-CM

## 2016-02-24 NOTE — Telephone Encounter (Signed)
Patient hasn't been seen in our office since March and he is requesting a refill on his testosterone cypionate.  Do we have a bottle in the office of his?

## 2016-03-09 ENCOUNTER — Ambulatory Visit (INDEPENDENT_AMBULATORY_CARE_PROVIDER_SITE_OTHER): Payer: BLUE CROSS/BLUE SHIELD | Admitting: Urology

## 2016-03-09 ENCOUNTER — Encounter: Payer: Self-pay | Admitting: Urology

## 2016-03-09 VITALS — BP 157/96 | HR 74 | Ht 70.0 in | Wt 259.1 lb

## 2016-03-09 DIAGNOSIS — N401 Enlarged prostate with lower urinary tract symptoms: Secondary | ICD-10-CM | POA: Diagnosis not present

## 2016-03-09 DIAGNOSIS — N138 Other obstructive and reflux uropathy: Secondary | ICD-10-CM

## 2016-03-09 DIAGNOSIS — E291 Testicular hypofunction: Secondary | ICD-10-CM

## 2016-03-09 DIAGNOSIS — N529 Male erectile dysfunction, unspecified: Secondary | ICD-10-CM

## 2016-03-09 DIAGNOSIS — N528 Other male erectile dysfunction: Secondary | ICD-10-CM

## 2016-03-09 MED ORDER — TESTOSTERONE CYPIONATE 200 MG/ML IM SOLN: INTRAMUSCULAR | 1 refills | Status: DC

## 2016-03-09 NOTE — Progress Notes (Signed)
03/09/2016 4:39 PM   James Hebert 07/26/1975 [id number]  Referring provider: Ignacia Bayley, PA-C [address] [facility] [address] [address] Med [place], [place] [address]  Chief Complaint  Patient presents with  . Hypogonadism    6 month follow up    HPI: Patient is a 40 year old Caucasian male who presents today for a 6 month follow up for hypogonadism, erectile dysfunction and BPH with LUTS.  Hypogonadism Patient is experiencing a lack of energy, a decrease in strength, erections being less strong and falling asleep after dinner.  This is indicated by his responses to the ADAM questionnaire.  He is no longer having spontaneous erections at night.   He does not have sleep apnea.   His last testosterone was 880 ng/dL on 62/13/0865.   He is currently managing his hypogonadism with testosterone cypionate 200 mg IM every week.  He has been self administering his testosterone.          Androgen Deficiency in the Aging Male    Row Name 03/09/16 1500         Androgen Deficiency in the Aging Male   Do you have a decrease in libido (sex drive) No     Do you have lack of energy Yes     Do you have a decrease in strength and/or endurance Yes     Have you lost height No     Have you noticed a decreased "enjoyment of life" No     Are you sad and/or grumpy No     Are your erections less strong Yes     Have you noticed a recent deterioration in your ability to play sports No     Are you falling asleep after dinner Yes     Has there been a recent deterioration in your work performance No       Erectile dysfunction His SHIM score is 20, which is mild ED.   His previous SHIM score was 20.  He has been having difficulty with erections for over 6 years.   His major complaint is achieving an erection.  His libido is preserved.   His risk factors for ED are age, BPH, hypogonadism, HTN and HLD.  He denies any painful erections or curvatures with his erections.   He has tried sildenafil  in the past with good results.      SHIM    Row Name 03/09/16 1556         SHIM: Over the last 6 months:   How do you rate your confidence that you could get and keep an erection? Moderate     When you had erections with sexual stimulation, how often were your erections hard enough for penetration (entering your partner)? Almost Always or Always     During sexual intercourse, how often were you able to maintain your erection after you had penetrated (entered) your partner? Slightly Difficult     During sexual intercourse, how difficult was it to maintain your erection to completion of intercourse? Slightly Difficult     When you attempted sexual intercourse, how often was it satisfactory for you? Slightly Difficult       SHIM Total Score   SHIM 20        Score: 1-7 Severe ED 8-11 Moderate ED 12-16 Mild-Moderate ED 17-21 Mild ED 22-25 No ED  BPH WITH LUTS His IPSS score today is 4, which is mild lower urinary tract symptomatology. He is mostly satisfied with his  quality life due to his urinary symptoms.  His previous IPSS score was 6/2.  His major complaint today incompletely emptying, frequency, urgency and nocturia, but these are no bothersome to him.  He has had these symptoms for several years.  He denies any dysuria, hematuria or suprapubic pain.  He also denies any recent fevers, chills, nausea or vomiting.  He does not have a family history of PCa.      IPSS    Row Name 03/09/16 1500         International Prostate Symptom Score   How often have you had the sensation of not emptying your bladder? Less than 1 in 5     How often have you had to urinate less than every two hours? Less than 1 in 5 times     How often have you found you stopped and started again several times when you urinated? Not at All     How often have you found it difficult to postpone urination? Less than 1 in 5 times     How often have you had a weak urinary stream? Not at All     How often have  you had to strain to start urination? Not at All     How many times did you typically get up at night to urinate? 1 Time     Total IPSS Score 4       Quality of Life due to urinary symptoms   If you were to spend the rest of your life with your urinary condition just the way it is now how would you feel about that? Mostly Satisfied        Score:  1-7 Mild 8-19 Moderate 20-35 Severe      PMH: Past Medical History:  Diagnosis Date  . Benign enlargement of prostate   . Elevated blood pressure   . Erectile dysfunction   . Hypogonadism in male   . Over weight     Surgical History: Past Surgical History:  Procedure Laterality Date  . CHOLECYSTECTOMY    . thrombosis of leg      Home Medications:    Medication List       Accurate as of 03/09/16  4:39 PM. Always use your most recent med list.          HYDROcodone-acetaminophen 5-325 MG tablet Commonly known as:  NORCO/VICODIN Take by mouth.   predniSONE 10 MG tablet Commonly known as:  DELTASONE Take by mouth.   sildenafil 20 MG tablet Commonly known as:  REVATIO Take 3 to 5 tablets two hours before intercouse on an empty stomach.  Do not take with nitrates.   testosterone cypionate 200 MG/ML injection Commonly known as:  DEPOTESTOSTERONE CYPIONATE Inject 1mL every 7 days       Allergies: No Known Allergies  Family History: Family History  Problem Relation Age of Onset  . Prostate cancer Neg Hx   . Bladder Cancer Neg Hx     Social History:  reports that he has quit smoking. He has never used smokeless tobacco. He reports that he drinks alcohol. His drug history is not on file.  ROS: UROLOGY Frequent Urination?: No Hard to postpone urination?: No Burning/pain with urination?: No Get up at night to urinate?: No Leakage of urine?: No Urine stream starts and stops?: No Trouble starting stream?: No Do you have to strain to urinate?: No Blood in urine?: No Urinary tract infection?: No Sexually  transmitted disease?: No Injury to  kidneys or bladder?: No Painful intercourse?: No Weak stream?: No Erection problems?: No Penile pain?: No  Gastrointestinal Nausea?: No Vomiting?: No Indigestion/heartburn?: No Diarrhea?: No Constipation?: No  Constitutional Fever: No Night sweats?: No Weight loss?: No Fatigue?: No  Skin Skin rash/lesions?: No Itching?: No  Eyes Blurred vision?: No Double vision?: No  Ears/Nose/Throat Sore throat?: No Sinus problems?: No  Hematologic/Lymphatic Swollen glands?: No Easy bruising?: No  Cardiovascular Leg swelling?: No Chest pain?: No  Respiratory Cough?: No Shortness of breath?: No  Endocrine Excessive thirst?: No  Musculoskeletal Back pain?: No Joint pain?: No  Neurological Headaches?: No Dizziness?: No  Psychologic Depression?: No Anxiety?: No  Physical Exam: BP (!) 157/96 (BP Location: Left Arm, Patient Position: Sitting, Cuff Size: Large)   Pulse 74   Ht 5\' 10"  (1.778 m)   Wt 259 lb 1.6 oz (117.5 kg)   BMI 37.18 kg/m   Constitutional: Well nourished. Alert and oriented, No acute distress. HEENT: [place] AT, moist mucus membranes. Trachea midline, no masses. Cardiovascular: No clubbing, cyanosis, or edema. Respiratory: Normal respiratory effort, no increased work of breathing. GI: Abdomen is soft, non tender, non distended, no abdominal masses. Liver and spleen not palpable.  No hernias appreciated.  Stool sample for occult testing is not indicated.   GU: No CVA tenderness.  No bladder fullness or masses.  Patient with circumcised phallus.  Urethral meatus is patent.  No penile discharge. No penile lesions or rashes. Scrotum without lesions, cysts, rashes and/or edema.  Testicles are located scrotally bilaterally. No masses are appreciated in the testicles. Left and right epididymis are normal. Rectal: Patient with  normal sphincter tone. Anus and perineum without scarring or rashes. No rectal masses are  appreciated. Prostate is approximately 35 grams, no nodules are appreciated. Seminal vesicles are normal. Skin: No rashes, bruises or suspicious lesions. Lymph: No cervical or inguinal adenopathy. Neurologic: Grossly intact, no focal deficits, moving all 4 extremities. Psychiatric: Normal mood and affect.  Laboratory Data: Lab Results  Component Value Date   WBC 6.9 12/24/2011   HGB 14.3 12/24/2011   HCT 51.9 (H) 12/31/2014   MCV 94 12/24/2011   PLT 167 12/24/2011    Lab Results  Component Value Date   CREATININE 1.54 (H) 12/24/2011    Lab Results  Component Value Date   TESTOSTERONE 880 12/31/2014     Lab Results  Component Value Date   TSH 2.310 09/05/2015       Component Value Date/Time   CHOL 162 09/05/2015 0932   HDL 55 09/05/2015 0932   LDLCALC 81 09/05/2015 0932    Lab Results  Component Value Date   AST 26 09/05/2015   Lab Results  Component Value Date   ALT 85 (H) 09/05/2015      Assessment & Plan:    1. Hypogonadism:     -most recent testosterone level is 880 ng/dL on 16/03/9603  -continue testosterone cypionate injections  - patient advised that he needs to have [place] injection the medication or we can no longer prescribe the medication-he agrees and will start receiving his injections in the office  - Testosterone  - Hematocrit  - Hepatic function panel  - RTC in 3 months for HCT and testosterone  - RTC in 6 months for HCT, testosterone, PSA, LFT's, ADAM and exam  2. BPH with LUTS  - IPSS score is 4/2, it is stable  - Continue conservative management, avoiding bladder irritants and timed voiding's  - PSA  - RTC in 6  months for IPSS, PSA and exam, as testosterone therapy can cause prostate enlargement and worsen LUTS  3. Erectile dysfunction:     -SHIM score is 20  -continue sildenafil 20 mg  -RTC in 6 months for SHIM score and exam, as testosterone therapy can affect erections  Return for schedule testosterone injections.  These  notes generated with voice recognition software. I apologize for typographical errors.  Michiel Cowboy, PA-C  [facility] [address] [place], [place] [address] [phone]

## 2016-03-10 ENCOUNTER — Telehealth: Payer: Self-pay

## 2016-03-10 LAB — HEPATIC FUNCTION PANEL
ALT: 52 IU/L — AB (ref 0–44)
AST: 31 IU/L (ref 0–40)
Albumin: 4.8 g/dL (ref 3.5–5.5)
Alkaline Phosphatase: 51 IU/L (ref 39–117)
BILIRUBIN, DIRECT: 0.17 mg/dL (ref 0.00–0.40)
Bilirubin Total: 0.6 mg/dL (ref 0.0–1.2)
TOTAL PROTEIN: 7.4 g/dL (ref 6.0–8.5)

## 2016-03-10 LAB — PSA: PROSTATE SPECIFIC AG, SERUM: 0.6 ng/mL (ref 0.0–4.0)

## 2016-03-10 LAB — HEMATOCRIT: HEMATOCRIT: 49.2 % (ref 37.5–51.0)

## 2016-03-10 LAB — TESTOSTERONE: TESTOSTERONE: 288 ng/dL (ref 264–916)

## 2016-03-10 NOTE — Telephone Encounter (Signed)
He will need to schedule an appointment for the injections.  He wanted to come on Fridays.  We have sent a script to his pharmacy for the testosterone cypionate.

## 2016-03-10 NOTE — Telephone Encounter (Signed)
Spoke with pt in reference to lab results and clomid. Pt requested to start testosterone injections again. Please advise.

## 2016-03-10 NOTE — Telephone Encounter (Signed)
-----   Message from Harle BattiestShannon A McGowan, PA-C sent at 03/10/2016 12:09 PM EDT ----- Patient's labs look good, but one of his liver enzymes are still elevated.  It has come down though.  He cannot have Clomid at this time.

## 2016-03-11 NOTE — Telephone Encounter (Signed)
LMOM- medication sent to pharmacy. Will need an appt for injection.

## 2016-10-01 ENCOUNTER — Other Ambulatory Visit: Payer: Self-pay | Admitting: Urology

## 2016-10-01 DIAGNOSIS — N529 Male erectile dysfunction, unspecified: Secondary | ICD-10-CM

## 2016-10-01 NOTE — Telephone Encounter (Signed)
Pt pharmacy sent a refill request for sildenafil. Medication denied as pt has not been seen in 23mo as dictated by [facility]

## 2016-10-07 ENCOUNTER — Other Ambulatory Visit: Payer: Self-pay | Admitting: *Deleted

## 2016-10-07 DIAGNOSIS — N400 Enlarged prostate without lower urinary tract symptoms: Secondary | ICD-10-CM

## 2016-10-07 DIAGNOSIS — E291 Testicular hypofunction: Secondary | ICD-10-CM

## 2016-10-08 ENCOUNTER — Ambulatory Visit (INDEPENDENT_AMBULATORY_CARE_PROVIDER_SITE_OTHER): Payer: BLUE CROSS/BLUE SHIELD | Admitting: Urology

## 2016-10-08 ENCOUNTER — Other Ambulatory Visit
Admission: RE | Admit: 2016-10-08 | Discharge: 2016-10-08 | Disposition: A | Discharge status: Home / Self Care | Payer: BLUE CROSS/BLUE SHIELD | Source: Ambulatory Visit | Attending: Urology | Admitting: Urology

## 2016-10-08 ENCOUNTER — Encounter: Payer: Self-pay | Admitting: Urology

## 2016-10-08 VITALS — BP 160/97 | HR 70 | Ht 70.0 in | Wt 258.0 lb

## 2016-10-08 DIAGNOSIS — N401 Enlarged prostate with lower urinary tract symptoms: Secondary | ICD-10-CM

## 2016-10-08 DIAGNOSIS — N400 Enlarged prostate without lower urinary tract symptoms: Secondary | ICD-10-CM | POA: Insufficient documentation

## 2016-10-08 DIAGNOSIS — E291 Testicular hypofunction: Secondary | ICD-10-CM | POA: Diagnosis not present

## 2016-10-08 DIAGNOSIS — N529 Male erectile dysfunction, unspecified: Secondary | ICD-10-CM

## 2016-10-08 DIAGNOSIS — N138 Other obstructive and reflux uropathy: Secondary | ICD-10-CM | POA: Diagnosis not present

## 2016-10-08 LAB — CBC WITH DIFFERENTIAL/PLATELET
BASOS PCT: 1 %
Basophils Absolute: 0.1 10*3/uL (ref 0–0.1)
EOS ABS: 0.3 10*3/uL (ref 0–0.7)
Eosinophils Relative: 5 %
HEMATOCRIT: 52.3 % — AB (ref 40.0–52.0)
HEMOGLOBIN: 17.8 g/dL (ref 13.0–18.0)
Lymphocytes Relative: 31 %
Lymphs Abs: 1.7 10*3/uL (ref 1.0–3.6)
MCH: 30.2 pg (ref 26.0–34.0)
MCHC: 33.9 g/dL (ref 32.0–36.0)
MCV: 88.9 fL (ref 80.0–100.0)
MONO ABS: 0.6 10*3/uL (ref 0.2–1.0)
MONOS PCT: 11 %
Neutro Abs: 2.8 10*3/uL (ref 1.4–6.5)
Neutrophils Relative %: 52 %
Platelets: 175 10*3/uL (ref 150–440)
RBC: 5.88 MIL/uL (ref 4.40–5.90)
RDW: 12.9 % (ref 11.5–14.5)
WBC: 5.4 10*3/uL (ref 3.8–10.6)

## 2016-10-08 LAB — PSA: PSA: 0.32 ng/mL (ref 0.00–4.00)

## 2016-10-08 MED ORDER — TESTOSTERONE CYPIONATE 200 MG/ML IM SOLN: INTRAMUSCULAR | 1 refills | Status: DC

## 2016-10-08 NOTE — Progress Notes (Signed)
10/08/2016 11:02 AM   James Hebert 11/17/1975 [id number]  Referring provider: Ignacia Bayley, PA-C [address] [facility] [address] [place], [place] [address]  Chief Complaint  Patient presents with  . Hypogonadism    6 month follow up  . Benign Prostatic Hypertrophy    HPI: Patient is a 41 year old Caucasian male who presents today for a 6 month follow up for hypogonadism, erectile dysfunction and BPH with LUTS.  Hypogonadism Patient is experiencing a lack of energy, a decrease in strength, erections being less strong and falling asleep after dinner.  This is indicated by his responses to the ADAM questionnaire.  He is no longer having spontaneous erections at night.  He has sleep apnea and is sleeping with a CPAP machine.  His last testosterone was 288 ng/dL on 84/13/2440.  HCT is elevated at today's visit.  LFT's were elevated in 02/2016.  He is currently managing his hypogonadism with testosterone cypionate 200 mg IM every week.  He has been self administering his testosterone.         Androgen Deficiency in the Aging Male    Row Name 10/08/16 1000         Androgen Deficiency in the Aging Male   Do you have a decrease in libido (sex drive) No     Do you have lack of energy Yes     Do you have a decrease in strength and/or endurance Yes     Have you lost height No     Have you noticed a decreased "enjoyment of life" No     Are you sad and/or grumpy No     Are your erections less strong Yes     Have you noticed a recent deterioration in your ability to play sports No     Are you falling asleep after dinner Yes     Has there been a recent deterioration in your work performance No        Erectile dysfunction His SHIM score is 14, which is mild to moderate ED.   His previous SHIM score was 20.  He has been having difficulty with erections for over 7 years.   His major complaint is achieving an erection.  His libido is preserved.   His risk factors for  ED are age, BPH, hypogonadism, HTN and HLD.  He denies any painful erections or curvatures with his erections.   He has tried sildenafil in the past with good results.      SHIM    Row Name 10/08/16 1042         SHIM: Over the last 6 months:   How do you rate your confidence that you could get and keep an erection? Very Low     When you had erections with sexual stimulation, how often were your erections hard enough for penetration (entering your partner)? Sometimes (about half the time)     During sexual intercourse, how often were you able to maintain your erection after you had penetrated (entered) your partner? Sometimes (about half the time)     During sexual intercourse, how difficult was it to maintain your erection to completion of intercourse? Slightly Difficult     When you attempted sexual intercourse, how often was it satisfactory for you? Sometimes (about half the time)       SHIM Total Score   SHIM 14        Score: 1-7 Severe ED 8-11 Moderate ED 12-16 Mild-Moderate  ED 17-21 Mild ED 22-25 No ED    BPH WITH LUTS His IPSS score today is 2, which is mild lower urinary tract symptomatology. He is pleased with his quality life due to his urinary symptoms.  His previous IPSS score was 4/2.  His major complaint today incompletely emptying, frequency, urgency and nocturia, but these are no bothersome to him.  He has had these symptoms for several years.  He denies any dysuria, hematuria or suprapubic pain.   He also denies any recent fevers, chills, nausea or vomiting.  He does not have a family history of PCa.      IPSS    Row Name 10/08/16 1000         International Prostate Symptom Score   How often have you had the sensation of not emptying your bladder? Not at All     How often have you had to urinate less than every two hours? Less than 1 in 5 times     How often have you found you stopped and started again several times when you urinated? Not at All     How  often have you found it difficult to postpone urination? Not at All     How often have you had a weak urinary stream? Not at All     How often have you had to strain to start urination? Not at All     How many times did you typically get up at night to urinate? 1 Time     Total IPSS Score 2       Quality of Life due to urinary symptoms   If you were to spend the rest of your life with your urinary condition just the way it is now how would you feel about that? Pleased        Score:  1-7 Mild 8-19 Moderate 20-35 Severe      PMH: Past Medical History:  Diagnosis Date  . Benign enlargement of prostate   . Elevated blood pressure   . Erectile dysfunction   . Hypogonadism in male   . Over weight     Surgical History: Past Surgical History:  Procedure Laterality Date  . CHOLECYSTECTOMY    . KNEE ARTHROSCOPY Right    1990  . thrombosis of leg      Home Medications:  Allergies as of 10/08/2016   No Known Allergies     Medication List       Accurate as of 10/08/16 11:02 AM. Always use your most recent med list.          HYDROcodone-acetaminophen 5-325 MG tablet Commonly known as:  NORCO/VICODIN Take by mouth.   predniSONE 10 MG tablet Commonly known as:  DELTASONE Take by mouth.   sildenafil 20 MG tablet Commonly known as:  REVATIO Take 3 to 5 tablets two hours before intercouse on an empty stomach.  Do not take with nitrates.   testosterone cypionate 200 MG/ML injection Commonly known as:  DEPOTESTOSTERONE CYPIONATE Inject 1mL every 7 days       Allergies: No Known Allergies  Family History: Family History  Problem Relation Age of Onset  . Prostate cancer Neg Hx   . Bladder Cancer Neg Hx   . Kidney cancer Neg Hx     Social History:  reports that he has quit smoking. He has never used smokeless tobacco. He reports that he drinks alcohol. He reports that he does not use drugs.  ROS: UROLOGY Frequent Urination?: No  Hard to postpone urination?:  No Burning/pain with urination?: No Get up at night to urinate?: No Leakage of urine?: No Urine stream starts and stops?: No Trouble starting stream?: No Do you have to strain to urinate?: No Blood in urine?: No Urinary tract infection?: No Sexually transmitted disease?: No Injury to kidneys or bladder?: No Painful intercourse?: No Weak stream?: No Erection problems?: No Penile pain?: No  Gastrointestinal Nausea?: No Vomiting?: No Indigestion/heartburn?: No Diarrhea?: No Constipation?: No  Constitutional Fever: No Night sweats?: No Weight loss?: No Fatigue?: No  Skin Skin rash/lesions?: No Itching?: No  Eyes Blurred vision?: No Double vision?: No  Ears/Nose/Throat Sore throat?: No Sinus problems?: No  Hematologic/Lymphatic Swollen glands?: No Easy bruising?: No  Cardiovascular Leg swelling?: No Chest pain?: No  Respiratory Cough?: No Shortness of breath?: No  Endocrine Excessive thirst?: No  Musculoskeletal Back pain?: No Joint pain?: No  Neurological Headaches?: No Dizziness?: No  Psychologic Depression?: No Anxiety?: No  Physical Exam: BP (!) 160/97   Pulse 70   Ht  (1.778 m)   Wt 258 lb (117 kg)   BMI 37.02 kg/m   Constitutional: Well nourished. Alert and oriented, No acute distress. HEENT: [place] AT, moist mucus membranes. Trachea midline, no masses. Cardiovascular: No clubbing, cyanosis, or edema. Respiratory: Normal respiratory effort, no increased work of breathing. GI: Abdomen is soft, non tender, non distended, no abdominal masses. Liver and spleen not palpable.  No hernias appreciated.  Stool sample for occult testing is not indicated.   GU: No CVA tenderness.  No bladder fullness or masses.  Patient with circumcised phallus.  Urethral meatus is patent.  No penile discharge. No penile lesions or rashes. Scrotum without lesions, cysts, rashes and/or edema.  Testicles are located scrotally bilaterally. No masses are  appreciated in the testicles. Left and right epididymis are normal. Rectal: Patient with  normal sphincter tone. Anus and perineum without scarring or rashes. No rectal masses are appreciated. Prostate is approximately 35 grams, no nodules are appreciated. Seminal vesicles are normal. Skin: No rashes, bruises or suspicious lesions. Lymph: No cervical or inguinal adenopathy. Neurologic: Grossly intact, no focal deficits, moving all 4 extremities. Psychiatric: Normal mood and affect.  Laboratory Data: Lab Results  Component Value Date   WBC 5.4 10/08/2016   HGB 17.8 10/08/2016   HCT 52.3 (H) 10/08/2016   MCV 88.9 10/08/2016   PLT 175 10/08/2016    Lab Results  Component Value Date   CREATININE 1.54 (H) 12/24/2011    Lab Results  Component Value Date   TESTOSTERONE 288 03/09/2016     Lab Results  Component Value Date   TSH 2.310 09/05/2015       Component Value Date/Time   CHOL 162 09/05/2015 0932   HDL 55 09/05/2015 0932   LDLCALC 81 09/05/2015 0932    Lab Results  Component Value Date   AST 31 03/09/2016   Lab Results  Component Value Date   ALT 52 (H) 03/09/2016    Assessment & Plan:    1. Hypogonadism:     -most recent testosterone level is 288 ng/dL on 16/03/9603  -continue testosterone cypionate injections  - patient advised that he needs to have [place] injection the medication or we can no longer prescribe the medication-he agrees and will start receiving his injections in the office - we will terminate the patient physician relationship if he does not comply  -Testosterone, HCT drawn today   - RTC in 3 months for HCT and testosterone  -  RTC in 6 months for HCT, testosterone, PSA, ADAM and exam  2. BPH with LUTS  - IPSS score is 2/1, it is improving  - Continue conservative management, avoiding bladder irritants and timed voiding's  - PSA  - RTC in 6 months for IPSS, PSA and exam, as testosterone therapy can cause prostate enlargement and worsen  LUTS  3. Erectile dysfunction:     -SHIM score is 14, it is worsening  -continue sildenafil 20 mg  -RTC in 6 months for SHIM score and exam, as testosterone therapy can affect erections  Return for schedule testosterone injection on 04/27.  These notes generated with voice recognition software. I apologize for typographical errors.  Michiel Cowboy, PA-C  [facility] [address] [place], [place] [address] [phone]

## 2016-10-09 LAB — TESTOSTERONE: TESTOSTERONE: 337 ng/dL (ref 264–916)

## 2016-10-11 ENCOUNTER — Telehealth: Payer: Self-pay | Admitting: Urology

## 2016-10-11 DIAGNOSIS — N529 Male erectile dysfunction, unspecified: Secondary | ICD-10-CM

## 2016-10-11 MED ORDER — SILDENAFIL CITRATE 20 MG PO TABS: ORAL_TABLET | ORAL | 3 refills | Status: DC

## 2016-10-11 NOTE — Telephone Encounter (Signed)
[facility]

## 2016-10-11 NOTE — Telephone Encounter (Signed)
Please advise 

## 2016-10-11 NOTE — Telephone Encounter (Signed)
We faxed the testosterone prescription in this morning.  We will need to [facility]

## 2016-10-11 NOTE — Telephone Encounter (Signed)
Pt would like for his testosterone and sildenafil to both be sent to [facility].

## 2016-10-12 ENCOUNTER — Telehealth: Payer: Self-pay | Admitting: Urology

## 2016-10-12 ENCOUNTER — Other Ambulatory Visit: Payer: Self-pay | Admitting: Urology

## 2016-10-12 DIAGNOSIS — E291 Testicular hypofunction: Secondary | ICD-10-CM

## 2016-10-12 MED ORDER — TESTOSTERONE CYPIONATE 200 MG/ML IM SOLN: INTRAMUSCULAR | 1 refills | Status: DC

## 2016-10-12 NOTE — Telephone Encounter (Signed)
LMOM that we thought we had sent the [facility] but Carollee Herter will recheck and send it again if necessary.  Carollee Herter it looks [facility].

## 2016-10-12 NOTE — Telephone Encounter (Signed)
I believe that is where I sent the script.  I will double check and fax again if necessary.

## 2016-10-12 NOTE — Telephone Encounter (Signed)
Pt would like his testosterone filled through [facility] instead of [facility].  Please advise.

## 2016-10-13 ENCOUNTER — Telehealth: Payer: Self-pay

## 2016-10-13 NOTE — Telephone Encounter (Signed)
PA for sildenafil DENIED!  

## 2016-10-15 ENCOUNTER — Ambulatory Visit: Payer: BLUE CROSS/BLUE SHIELD | Admitting: Urology

## 2016-11-03 DIAGNOSIS — M7670 Peroneal tendinitis, unspecified leg: Secondary | ICD-10-CM | POA: Insufficient documentation

## 2016-11-05 ENCOUNTER — Ambulatory Visit (INDEPENDENT_AMBULATORY_CARE_PROVIDER_SITE_OTHER): Payer: BLUE CROSS/BLUE SHIELD

## 2016-11-05 DIAGNOSIS — E291 Testicular hypofunction: Secondary | ICD-10-CM

## 2016-11-05 MED ORDER — TESTOSTERONE CYPIONATE 200 MG/ML IM SOLN
200.0000 mg | Freq: Once | INTRAMUSCULAR | Status: AC
  Administered 2016-11-05: 200 mg via INTRAMUSCULAR

## 2016-11-05 NOTE — Progress Notes (Signed)
Testosterone IM Injection  Due to Hypogonadism patient is present today for a Testosterone Injection.  Medication: Testosterone Cypionate Dose: 1mL Location: right upper outer buttocks Lot: [id number] Exp:02/20  Patient tolerated well, no complications were noted  Preformed by: Rupert Stackshelsea Korby Ratay, LPN   Follow up: 1 week

## 2016-11-12 ENCOUNTER — Ambulatory Visit: Payer: Self-pay

## 2016-11-12 ENCOUNTER — Ambulatory Visit (INDEPENDENT_AMBULATORY_CARE_PROVIDER_SITE_OTHER): Payer: BLUE CROSS/BLUE SHIELD

## 2016-11-12 DIAGNOSIS — E291 Testicular hypofunction: Secondary | ICD-10-CM | POA: Diagnosis not present

## 2016-11-12 MED ORDER — TESTOSTERONE CYPIONATE 200 MG/ML IM SOLN
200.0000 mg | Freq: Once | INTRAMUSCULAR | Status: AC
  Administered 2016-11-12: 200 mg via INTRAMUSCULAR

## 2016-11-12 NOTE — Progress Notes (Signed)
Testosterone IM Injection  Due to Hypogonadism patient is present today for a Testosterone Injection.  Medication: Testosterone Cypionate Dose: 1mL Location: left upper outer buttocks Lot: [id number] Exp:07/2018  Patient tolerated well, no complications were noted  Preformed by: Rupert Stackshelsea Watkins, LPN   Follow up: 1 week

## 2016-11-19 ENCOUNTER — Ambulatory Visit (INDEPENDENT_AMBULATORY_CARE_PROVIDER_SITE_OTHER): Payer: BLUE CROSS/BLUE SHIELD

## 2016-11-19 DIAGNOSIS — E291 Testicular hypofunction: Secondary | ICD-10-CM

## 2016-11-19 MED ORDER — TESTOSTERONE CYPIONATE 200 MG/ML IM SOLN
200.0000 mg | Freq: Once | INTRAMUSCULAR | Status: AC
  Administered 2016-11-19: 200 mg via INTRAMUSCULAR

## 2016-11-19 NOTE — Progress Notes (Signed)
Testosterone IM Injection  Due to Hypogonadism patient is present today for a Testosterone Injection.  Medication: Testosterone Cypionate Dose: 1mL Location: left upper outer buttocks (per pt given on left side he states that it was given on right last week) Lot: [id number] Exp:07/2018  Patient tolerated well, no complications were noted  Preformed by: Eligha BridegroomSarah Aneri Slagel, CMA   Follow up: 1 week

## 2016-11-26 ENCOUNTER — Ambulatory Visit (INDEPENDENT_AMBULATORY_CARE_PROVIDER_SITE_OTHER): Payer: BLUE CROSS/BLUE SHIELD

## 2016-11-26 DIAGNOSIS — E291 Testicular hypofunction: Secondary | ICD-10-CM

## 2016-11-26 MED ORDER — TESTOSTERONE CYPIONATE 200 MG/ML IM SOLN
200.0000 mg | Freq: Once | INTRAMUSCULAR | Status: AC
  Administered 2016-11-26: 200 mg via INTRAMUSCULAR

## 2016-11-26 NOTE — Progress Notes (Signed)
Testosterone IM Injection  Due to Hypogonadism patient is present today for a Testosterone Injection.  Medication: Testosterone Cypionate Dose: 1ml Location: left upper outer buttocks (Pt requested LUOQ b/c he will have a Cortisone injection in RUOQ today.  Lot: 1ml Exp:02/20  Patient tolerated well, no complications were noted  Performed by: C. Rana SnareLowe, CMA  Follow up: 1 week

## 2016-12-03 ENCOUNTER — Ambulatory Visit (INDEPENDENT_AMBULATORY_CARE_PROVIDER_SITE_OTHER): Payer: BLUE CROSS/BLUE SHIELD | Admitting: *Deleted

## 2016-12-03 VITALS — BP 122/81 | HR 83 | Wt 257.3 lb

## 2016-12-03 DIAGNOSIS — E291 Testicular hypofunction: Secondary | ICD-10-CM | POA: Diagnosis not present

## 2016-12-03 MED ORDER — TESTOSTERONE CYPIONATE 200 MG/ML IM SOLN
200.0000 mg | Freq: Once | INTRAMUSCULAR | Status: AC
  Administered 2016-12-03: 200 mg via INTRAMUSCULAR

## 2016-12-03 NOTE — Progress Notes (Signed)
Testosterone IM Injection  Due to Hypogonadism patient is present today for a Testosterone Injection.  Medication: Testosterone Cypionate Dose: 1 ml  200mg  Location: right upper outer buttocks Lot: [id number] Exp:07/2018  Patient tolerated well, no complications were noted.  Preformed by: Dallas Schimkeamona Katrece Roediger CMA  Follow up: 1 week

## 2016-12-06 ENCOUNTER — Other Ambulatory Visit: Payer: Self-pay | Admitting: Urology

## 2016-12-06 DIAGNOSIS — N529 Male erectile dysfunction, unspecified: Secondary | ICD-10-CM

## 2016-12-06 MED ORDER — SILDENAFIL CITRATE 20 MG PO TABS: ORAL_TABLET | ORAL | 3 refills | Status: DC

## 2016-12-06 NOTE — Telephone Encounter (Signed)
Per Patient, [facility]  He will have to pay out of pocket.

## 2016-12-10 ENCOUNTER — Ambulatory Visit (INDEPENDENT_AMBULATORY_CARE_PROVIDER_SITE_OTHER): Payer: BLUE CROSS/BLUE SHIELD | Admitting: Family Medicine

## 2016-12-10 DIAGNOSIS — E291 Testicular hypofunction: Secondary | ICD-10-CM | POA: Diagnosis not present

## 2016-12-10 MED ORDER — TESTOSTERONE CYPIONATE 200 MG/ML IM SOLN
200.0000 mg | Freq: Once | INTRAMUSCULAR | Status: AC
  Administered 2016-12-10: 200 mg via INTRAMUSCULAR

## 2016-12-10 NOTE — Progress Notes (Signed)
Testosterone IM Injection  Due to Hypogonadism patient is present today for a Testosterone Injection.  Medication: Testosterone Cypionate Dose: 1 ml Location: left upper outer buttocks Lot: [id number] Exp:07/2018   Patient tolerated well, no complications were noted  Performed by: C. Rana SnareLowe, CMA  Follow up: 1 week

## 2016-12-17 ENCOUNTER — Ambulatory Visit (INDEPENDENT_AMBULATORY_CARE_PROVIDER_SITE_OTHER): Payer: BLUE CROSS/BLUE SHIELD

## 2016-12-17 DIAGNOSIS — E291 Testicular hypofunction: Secondary | ICD-10-CM | POA: Diagnosis not present

## 2016-12-17 MED ORDER — TESTOSTERONE CYPIONATE 200 MG/ML IM SOLN
200.0000 mg | Freq: Once | INTRAMUSCULAR | Status: AC
  Administered 2016-12-17: 200 mg via INTRAMUSCULAR

## 2016-12-17 NOTE — Progress Notes (Signed)
Testosterone IM Injection  Due to Hypogonadism patient is present today for a Testosterone Injection.  Medication: Testosterone Cypionate Dose: 1 ml Location: right upper outer buttocks Lot: [id number] Exp:07/2018  Patient tolerated well, no complications were noted  Performed by: C.Rana SnareLowe, CMA    Follow up: 1 week

## 2016-12-24 ENCOUNTER — Ambulatory Visit (INDEPENDENT_AMBULATORY_CARE_PROVIDER_SITE_OTHER): Payer: BLUE CROSS/BLUE SHIELD | Admitting: Family Medicine

## 2016-12-24 DIAGNOSIS — E291 Testicular hypofunction: Secondary | ICD-10-CM | POA: Diagnosis not present

## 2016-12-24 MED ORDER — TESTOSTERONE CYPIONATE 200 MG/ML IM SOLN
200.0000 mg | Freq: Once | INTRAMUSCULAR | Status: AC
  Administered 2016-12-24: 200 mg via INTRAMUSCULAR

## 2016-12-24 NOTE — Progress Notes (Signed)
Testosterone IM Injection  Due to Hypogonadism patient is present today for a Testosterone Injection.  Medication: Testosterone Cypionate Dose: 1ML Location: left upper outer buttocks Lot: [id number] Exp:07/2018  Patient tolerated well, no complications were noted  Preformed by: Teressa Lowerarrie Garrison, CMA  Follow up: 1 Week

## 2016-12-31 ENCOUNTER — Ambulatory Visit (INDEPENDENT_AMBULATORY_CARE_PROVIDER_SITE_OTHER): Payer: BLUE CROSS/BLUE SHIELD | Admitting: *Deleted

## 2016-12-31 DIAGNOSIS — E291 Testicular hypofunction: Secondary | ICD-10-CM | POA: Diagnosis not present

## 2016-12-31 MED ORDER — TESTOSTERONE CYPIONATE 200 MG/ML IM SOLN
200.0000 mg | Freq: Once | INTRAMUSCULAR | Status: AC
  Administered 2016-12-31: 200 mg via INTRAMUSCULAR

## 2016-12-31 NOTE — Progress Notes (Signed)
Testosterone IM Injection  Due to Hypogonadism patient is present today for a Testosterone Injection.  Medication: Testosterone Cypionate Dose: 1ml Location: right upper outer buttocks Lot: [id number] Exp:07/2018  Patient tolerated well, no complications were noted.  Preformed by: Dallas Schimkeamona Fitzgerald Dunne CMA  Follow up: 1 week

## 2017-01-07 ENCOUNTER — Ambulatory Visit (INDEPENDENT_AMBULATORY_CARE_PROVIDER_SITE_OTHER): Payer: BLUE CROSS/BLUE SHIELD | Admitting: Family Medicine

## 2017-01-07 DIAGNOSIS — E291 Testicular hypofunction: Secondary | ICD-10-CM | POA: Diagnosis not present

## 2017-01-07 MED ORDER — TESTOSTERONE CYPIONATE 200 MG/ML IM SOLN
200.0000 mg | Freq: Once | INTRAMUSCULAR | Status: AC
  Administered 2017-01-07: 200 mg via INTRAMUSCULAR

## 2017-01-07 NOTE — Progress Notes (Signed)
Testosterone IM Injection  Due to Hypogonadism patient is present today for a Testosterone Injection.  Medication: Testosterone Cypionate Dose: 1ML Location: left upper outer buttocks Lot: [id number] Exp:2/20  Patient tolerated well, no complications were noted  Preformed by: Teressa Lowerarrie Addalyne Vandehei, CMA  Follow up: 1 week

## 2017-01-14 ENCOUNTER — Ambulatory Visit (INDEPENDENT_AMBULATORY_CARE_PROVIDER_SITE_OTHER): Payer: BLUE CROSS/BLUE SHIELD

## 2017-01-14 DIAGNOSIS — E291 Testicular hypofunction: Secondary | ICD-10-CM | POA: Diagnosis not present

## 2017-01-14 MED ORDER — TESTOSTERONE CYPIONATE 200 MG/ML IM SOLN
200.0000 mg | Freq: Once | INTRAMUSCULAR | Status: AC
  Administered 2017-01-14: 200 mg via INTRAMUSCULAR

## 2017-01-14 NOTE — Progress Notes (Signed)
Testosterone IM Injection  Due to Hypogonadism patient is present today for a Testosterone Injection.  Medication: Testosterone Cypionate Dose: 1ML Location: right upper outer buttocks Lot: [id number] Exp:2/20  Patient tolerated well, no complications were noted  Preformed by: Eligha BridegroomSarah Watts, CMA  Follow up: 1 week, patient was told his last bottle of medication was used today and bring a refill for next week

## 2017-01-20 ENCOUNTER — Ambulatory Visit: Payer: BLUE CROSS/BLUE SHIELD

## 2017-01-28 ENCOUNTER — Ambulatory Visit: Payer: BLUE CROSS/BLUE SHIELD

## 2017-02-18 ENCOUNTER — Ambulatory Visit (INDEPENDENT_AMBULATORY_CARE_PROVIDER_SITE_OTHER): Payer: BLUE CROSS/BLUE SHIELD

## 2017-02-18 DIAGNOSIS — E291 Testicular hypofunction: Secondary | ICD-10-CM

## 2017-02-18 MED ORDER — TESTOSTERONE CYPIONATE 200 MG/ML IM SOLN
200.0000 mg | Freq: Once | INTRAMUSCULAR | Status: AC
  Administered 2017-02-18: 200 mg via INTRAMUSCULAR

## 2017-02-18 NOTE — Progress Notes (Signed)
Testosterone IM Injection  Due to Hypogonadism patient is present today for a Testosterone Injection.  Medication: Testosterone Cypionate Dose: 1mL Location: right upper outer buttocks Lot: [id number] Exp:03/20  Patient tolerated well, no complications were noted  Preformed by: Rupert Stackshelsea Saraann Enneking, LPN   Follow up: 1 week

## 2017-02-25 ENCOUNTER — Ambulatory Visit (INDEPENDENT_AMBULATORY_CARE_PROVIDER_SITE_OTHER): Payer: BLUE CROSS/BLUE SHIELD

## 2017-02-25 DIAGNOSIS — E349 Endocrine disorder, unspecified: Secondary | ICD-10-CM

## 2017-02-25 MED ORDER — TESTOSTERONE CYPIONATE 200 MG/ML IM SOLN
200.0000 mg | Freq: Once | INTRAMUSCULAR | Status: AC
  Administered 2017-02-25: 200 mg via INTRAMUSCULAR

## 2017-02-25 NOTE — Progress Notes (Signed)
Testosterone IM Injection  Due to Hypogonadism patient is present today for a Testosterone Injection.  Medication: Testosterone Cypionate Dose: 1mL Location: left upper outer buttocks Lot: [id number] Exp:08/2018  Patient tolerated well, no complications were noted  Preformed by: Rupert Stackshelsea Watkins, LPN  Follow up: 1 week

## 2017-03-04 ENCOUNTER — Encounter: Payer: Self-pay | Admitting: Urology

## 2017-03-04 ENCOUNTER — Ambulatory Visit: Payer: BLUE CROSS/BLUE SHIELD

## 2017-04-07 ENCOUNTER — Telehealth: Payer: Self-pay | Admitting: Urology

## 2017-04-07 DIAGNOSIS — E291 Testicular hypofunction: Secondary | ICD-10-CM

## 2017-04-07 NOTE — Telephone Encounter (Signed)
Pt called office LMOM asking for testosterone injection appt. Records show that he No Show his last injection appt and his last injection was on 9/07. Pt hasn't had labs nor office visit with Carollee HerterShannon since April. Per Ramona, can't sched pt to get injection to [facility] so that [facility] for how she wants to handle this. Please advise. Thanks.

## 2017-04-08 NOTE — Telephone Encounter (Signed)
Please advise 

## 2017-04-08 NOTE — Telephone Encounter (Signed)
Patient will need a testosterone before 10 am, PSA, HBG and HCT drawn prior to his appointment with me.  He needs an appointment with me prior to any more medication.

## 2017-04-12 NOTE — Telephone Encounter (Signed)
Spoke with pt in reference to needing labs and an OV. Pt stated he already had the appts made. Reinforced with pt to keep them so that he can continue injections. Pt voiced understanding.

## 2017-04-19 ENCOUNTER — Other Ambulatory Visit: Payer: BLUE CROSS/BLUE SHIELD

## 2017-04-19 DIAGNOSIS — E291 Testicular hypofunction: Secondary | ICD-10-CM

## 2017-04-20 LAB — HEMOGLOBIN: Hemoglobin: 16.2 g/dL (ref 13.0–17.7)

## 2017-04-20 LAB — PSA: Prostate Specific Ag, Serum: 0.4 ng/mL (ref 0.0–4.0)

## 2017-04-20 LAB — HEMATOCRIT: HEMATOCRIT: 46.5 % (ref 37.5–51.0)

## 2017-04-20 LAB — TESTOSTERONE: TESTOSTERONE: 120 ng/dL — AB (ref 264–916)

## 2017-04-22 ENCOUNTER — Other Ambulatory Visit: Payer: BLUE CROSS/BLUE SHIELD

## 2017-04-29 ENCOUNTER — Ambulatory Visit: Payer: BLUE CROSS/BLUE SHIELD | Admitting: Urology

## 2017-05-01 NOTE — Progress Notes (Deleted)
05/02/2017 7:12 PM   James Hebert May 13, 1976 [id number]  Referring provider: Ignacia Bayleyumey, Robert, PA-C [address] [facility] [place] [id number]  No chief complaint on file.   HPI: Patient is a 41 year old Caucasian male who presents today for a 6 month follow up for testosterone deficiency, erectile dysfunction and BPH with LUTS.  Hypogonadism Patient is experiencing a lack of energy, a decrease in strength, erections being less strong and falling asleep after dinner.  This is indicated by his responses to the ADAM questionnaire.  He is no longer having spontaneous erections at night.  He has sleep apnea and is sleeping with a CPAP machine.  His last testosterone was 120 ng/dL on 69/62/952810/08/2016.  HBG and HCT are normal.  He is currently managing his testosterone deficiency with testosterone cypionate 200 mg IM every week.  He has been self administering his testosterone.       Erectile dysfunction His SHIM score is ***, which is *** ED.   His previous SHIM score was 14.  He has been having difficulty with erections for over 7 years.   His major complaint is achieving an erection.  His libido is preserved.   His risk factors for ED are age, BPH, hypogonadism, HTN and HLD.  He denies any painful erections or curvatures with his erections.   He has tried sildenafil in the past with good results.    Score: 1-7 Severe ED 8-11 Moderate ED 12-16 Mild-Moderate ED 17-21 Mild ED 22-25 No ED    BPH WITH LUTS His IPSS score today is ***, which is *** lower urinary tract symptomatology. He is *** with his quality life due to his urinary symptoms.  His previous IPSS score was 2/0.  His major complaint today incompletely emptying, frequency, urgency and nocturia, but these are no bothersome to him.  He has had these symptoms for several years.  He denies any dysuria, hematuria or suprapubic pain.   He also denies any recent fevers, chills, nausea or vomiting.  He  does not have a family history of PCa.    Score:  1-7 Mild 8-19 Moderate 20-35 Severe      PMH: Past Medical History:  Diagnosis Date  . Benign enlargement of prostate   . Elevated blood pressure   . Erectile dysfunction   . Hypogonadism in male   . Over weight     Surgical History: Past Surgical History:  Procedure Laterality Date  . CHOLECYSTECTOMY    . KNEE ARTHROSCOPY Right    1990  . thrombosis of leg      Home Medications:  Allergies as of 05/02/2017   No Known Allergies     Medication List        Accurate as of 05/01/17  7:12 PM. Always use your most recent med list.          HYDROcodone-acetaminophen 5-325 MG tablet Commonly known as:  NORCO/VICODIN Take by mouth.   predniSONE 10 MG tablet Commonly known as:  DELTASONE Take by mouth.   sildenafil 20 MG tablet Commonly known as:  REVATIO Take 3 to 5 tablets two hours before intercouse on an empty stomach.  Do not take with nitrates.   testosterone cypionate 200 MG/ML injection Commonly known as:  DEPOTESTOSTERONE CYPIONATE Inject 1mL every 7 days       Allergies: No Known Allergies  Family History: Family History  Problem Relation Age of Onset  . Prostate cancer Neg Hx   .  Bladder Cancer Neg Hx   . Kidney cancer Neg Hx     Social History:  reports that he has quit smoking. he has never used smokeless tobacco. He reports that he drinks alcohol. He reports that he does not use drugs.  ROS:                                        Physical Exam: There were no vitals taken for this visit.  Constitutional: Well nourished. Alert and oriented, No acute distress. HEENT: [place] AT, moist mucus membranes. Trachea midline, no masses. Cardiovascular: No clubbing, cyanosis, or edema. Respiratory: Normal respiratory effort, no increased work of breathing. GI: Abdomen is soft, non tender, non distended, no abdominal masses. Liver and spleen not palpable.  No hernias  appreciated.  Stool sample for occult testing is not indicated.   GU: No CVA tenderness.  No bladder fullness or masses.  Patient with circumcised phallus.  Urethral meatus is patent.  No penile discharge. No penile lesions or rashes. Scrotum without lesions, cysts, rashes and/or edema.  Testicles are located scrotally bilaterally. No masses are appreciated in the testicles. Left and right epididymis are normal. Rectal: Patient with  normal sphincter tone. Anus and perineum without scarring or rashes. No rectal masses are appreciated. Prostate is approximately 35 grams, no nodules are appreciated. Seminal vesicles are normal. Skin: No rashes, bruises or suspicious lesions. Lymph: No cervical or inguinal adenopathy. Neurologic: Grossly intact, no focal deficits, moving all 4 extremities. Psychiatric: Normal mood and affect.  Laboratory Data: PSA History  0.4 ng/mL on 03/23/2017 Lab Results  Component Value Date   WBC 5.4 10/08/2016   HGB 16.2 04/19/2017   HCT 46.5 04/19/2017   MCV 88.9 10/08/2016   PLT 175 10/08/2016    Lab Results  Component Value Date   TESTOSTERONE 120 (L) 04/19/2017   I have reviewed the labs.    Assessment & Plan:    1.  Testosterone deficiency:     -most recent testosterone level is 120 ng/dL on 16/10/960410/08/2016 (therapeutic levels 450 -600)  -continue testosterone cypionate injections  - RTC in 3 months for HCT, HBG and testosterone  - RTC in 6 months for HCT, HBG, testosterone, PSA, ADAM and exam  2. BPH with LUTS  - IPSS score is 2/1, it is improving  - Continue conservative management, avoiding bladder irritants and timed voiding's  - RTC in 6 months for IPSS, PSA and exam, as testosterone therapy can cause prostate enlargement and worsen LUTS  3. Erectile dysfunction:     -SHIM score is 14, it is worsening  -continue sildenafil 20 mg  -RTC in 6 months for SHIM score and exam, as testosterone therapy can affect erections  No Follow-up on  file.  These notes generated with voice recognition software. I apologize for typographical errors.  Michiel CowboySHANNON Shavonda Wiedman, PA-C  [facility] [address], [address] [place], [place] [address] [phone]

## 2017-05-02 ENCOUNTER — Ambulatory Visit: Payer: BLUE CROSS/BLUE SHIELD | Admitting: Urology

## 2017-05-04 NOTE — Progress Notes (Signed)
05/05/2017 9:27 PM   James Hebert [birth year] [id number]  Referring provider: Ignacia Bayley, PA-C [address] [facility] [place], [place] [address]  Chief Complaint  Patient presents with  . Hypogonadism    HPI: Patient is a 41 year old Caucasian male who presents today for a 6 month follow up for testosterone deficiency, erectile dysfunction and BPH with LUTS.  Testosterone deficiency Patient is experiencing a decrease in libido, lack of energy, a decrease in strength and/or endurance, erections less strong and falling asleep after dinner.  This is indicated by his responses to the ADAM questionnaire.  He is still having spontaneous erections at night.  He has sleep apnea and is sleeping with a CPAP machine.  His last testosterone was 120 ng/dL on 64/40/3474.  HBG and HCT are normal.  He is currently managing his testosterone deficiency with testosterone cypionate 200 mg IM every week.  He has been self administering his testosterone.     Androgen Deficiency in the Aging Male    Row Name 05/05/17 1500         Androgen Deficiency in the Aging Male   Do you have a decrease in libido (sex drive)  Yes     Do you have lack of energy  Yes     Do you have a decrease in strength and/or endurance  Yes     Have you lost height  No     Have you noticed a decreased "enjoyment of life"  No     Are you sad and/or grumpy  No     Are your erections less strong  Yes     Have you noticed a recent deterioration in your ability to play sports  No     Are you falling asleep after dinner  Yes     Has there been a recent deterioration in your work performance  Yes        Erectile dysfunction His SHIM score is 16, which is mild to moderate ED.   His previous SHIM score was 14.  He has been having difficulty with erections for over 7 years.   His major complaint is achieving an erection.  His libido is preserved.   His risk factors for ED are age, BPH, hypogonadism,  HTN and HLD.  He denies any painful erections or curvatures with his erections.   He has tried sildenafil in the past with good results.  SHIM    Row Name 05/05/17 1553         SHIM: Over the last 6 months:   How do you rate your confidence that you could get and keep an erection?  Low     When you had erections with sexual stimulation, how often were your erections hard enough for penetration (entering your partner)?  Most Times (much more than half the time)     During sexual intercourse, how often were you able to maintain your erection after you had penetrated (entered) your partner?  Sometimes (about half the time)     During sexual intercourse, how difficult was it to maintain your erection to completion of intercourse?  Slightly Difficult     When you attempted sexual intercourse, how often was it satisfactory for you?  Sometimes (about half the time)       SHIM Total Score   SHIM  16        Score: 1-7 Severe ED 8-11 Moderate ED 12-16 Mild-Moderate ED 17-21 Mild  ED 22-25 No ED    BPH WITH LUTS His IPSS score today is 4, which is mild lower urinary tract symptomatology. He is mostly satisfied with his quality life due to his urinary symptoms.  His previous IPSS score was 2/0.  His major complaint today incompletely emptying, frequency and nocturia, but these are no bothersome to him.  He has had these symptoms for several years.  He denies any dysuria, hematuria or suprapubic pain.   He also denies any recent fevers, chills, nausea or vomiting.  He does not have a family history of PCa.  IPSS    Row Name 05/05/17 1500         International Prostate Symptom Score   How often have you had the sensation of not emptying your bladder?  Less than 1 in 5     How often have you had to urinate less than every two hours?  Less than half the time     How often have you found you stopped and started again several times when you urinated?  Not at All     How often have you found it  difficult to postpone urination?  Not at All     How often have you had a weak urinary stream?  Not at All     How often have you had to strain to start urination?  Not at All     How many times did you typically get up at night to urinate?  1 Time     Total IPSS Score  4       Quality of Life due to urinary symptoms   If you were to spend the rest of your life with your urinary condition just the way it is now how would you feel about that?  Mostly Satisfied        Score:  1-7 Mild 8-19 Moderate 20-35 Severe      PMH: Past Medical History:  Diagnosis Date  . Benign enlargement of prostate   . Elevated blood pressure   . Erectile dysfunction   . Hypogonadism in male   . Over weight     Surgical History: Past Surgical History:  Procedure Laterality Date  . CHOLECYSTECTOMY    . KNEE ARTHROSCOPY Right    1990  . thrombosis of leg      Home Medications:  Allergies as of 05/05/2017   No Known Allergies     Medication List        Accurate as of 05/05/17 11:59 PM. Always use your most recent med list.          sildenafil 20 MG tablet Commonly known as:  REVATIO Take 3 to 5 tablets two hours before intercouse on an empty stomach.  Do not take with nitrates.   testosterone cypionate 200 MG/ML injection Commonly known as:  DEPOTESTOSTERONE CYPIONATE Inject 1mL every 7 days       Allergies: No Known Allergies  Family History: Family History  Problem Relation Age of Onset  . Prostate cancer Neg Hx   . Bladder Cancer Neg Hx   . Kidney cancer Neg Hx     Social History:  reports that he has quit smoking. he has never used smokeless tobacco. He reports that he drinks alcohol. He reports that he does not use drugs.  ROS: UROLOGY Frequent Urination?: No Hard to postpone urination?: No Burning/pain with urination?: No Get up at night to urinate?: No Leakage of urine?: No Urine stream starts  and stops?: No Trouble starting stream?: No Do you have to  strain to urinate?: No Blood in urine?: No Urinary tract infection?: No Sexually transmitted disease?: No Injury to kidneys or bladder?: No Painful intercourse?: No Weak stream?: No Erection problems?: No Penile pain?: No  Gastrointestinal Nausea?: No Vomiting?: No Indigestion/heartburn?: No Diarrhea?: No Constipation?: No  Constitutional Fever: No Night sweats?: No Weight loss?: No Fatigue?: No  Skin Skin rash/lesions?: No Itching?: No  Eyes Blurred vision?: No Double vision?: No  Ears/Nose/Throat Sore throat?: No Sinus problems?: No  Hematologic/Lymphatic Swollen glands?: No Easy bruising?: No  Cardiovascular Leg swelling?: No Chest pain?: No  Respiratory Cough?: No Shortness of breath?: No  Endocrine Excessive thirst?: No  Musculoskeletal Back pain?: No Joint pain?: No  Neurological Headaches?: No Dizziness?: No  Psychologic Depression?: No Anxiety?: No  Physical Exam: BP (!) 179/100   Pulse 68   Ht 5\' 10"  (1.778 m)   Wt 248 lb 11.2 oz (112.8 kg)   BMI 35.68 kg/m   Constitutional: Well nourished. Alert and oriented, No acute distress. HEENT: [place] AT, moist mucus membranes. Trachea midline, no masses. Cardiovascular: No clubbing, cyanosis, or edema. Respiratory: Normal respiratory effort, no increased work of breathing. GI: Abdomen is soft, non tender, non distended, no abdominal masses. Liver and spleen not palpable.  No hernias appreciated.  Stool sample for occult testing is not indicated.   GU: No CVA tenderness.  No bladder fullness or masses.  Patient with circumcised phallus.  Urethral meatus is patent.  No penile discharge. No penile lesions or rashes. Scrotum without lesions, cysts, rashes and/or edema.  Testicles are located scrotally bilaterally. No masses are appreciated in the testicles. Left and right epididymis are normal. Rectal: Patient with  normal sphincter tone. Anus and perineum without scarring or rashes. No rectal  masses are appreciated. Prostate is approximately 35 grams, no nodules are appreciated. Seminal vesicles are normal. Skin: No rashes, bruises or suspicious lesions. Lymph: No cervical or inguinal adenopathy. Neurologic: Grossly intact, no focal deficits, moving all 4 extremities. Psychiatric: Normal mood and affect.  Laboratory Data: PSA History  0.4 ng/mL on 03/23/2017 Lab Results  Component Value Date   WBC 5.4 10/08/2016   HGB 16.2 04/19/2017   HCT 46.5 04/19/2017   MCV 88.9 10/08/2016   PLT 175 10/08/2016    Lab Results  Component Value Date   TESTOSTERONE 120 (L) 04/19/2017   I have reviewed the labs.    Assessment & Plan:    1.  Testosterone deficiency:     -most recent testosterone level is 120 ng/dL on 81/19/147810/08/2016 (therapeutic levels 450 -600)  -continue testosterone cypionate injections, 200 mg IM q week; refills given  - RTC in 3 months for HCT, HBG and testosterone one week after injection  - RTC in 6 months for HCT, HBG, testosterone one week after injection, PSA, ADAM and exam  2. BPH with LUTS  - IPSS score is 4/2, it is worsening  - Continue conservative management, avoiding bladder irritants and timed voiding's  - RTC in 6 months for IPSS, PSA and exam, as testosterone therapy can cause prostate enlargement and worsen LUTS  3. Erectile dysfunction:     -SHIM score is 16, it is improved  -continue sildenafil 20 mg; refills given  -RTC in 6 months for SHIM score and exam, as testosterone therapy can affect erections  Return in about 3 weeks (around 05/26/2017) for testosterone one week after an injection, HCT and HBG only and then office  visit  in  6 months.  These notes generated with voice recognition software. I apologize for typographical errors.  Michiel Cowboy, PA-C  [facility] [address] [place], [place] [address] [phone]

## 2017-05-05 ENCOUNTER — Ambulatory Visit (INDEPENDENT_AMBULATORY_CARE_PROVIDER_SITE_OTHER): Payer: BLUE CROSS/BLUE SHIELD | Admitting: Urology

## 2017-05-05 ENCOUNTER — Encounter: Payer: Self-pay | Admitting: Urology

## 2017-05-05 VITALS — BP 179/100 | HR 68 | Ht 70.0 in | Wt 248.7 lb

## 2017-05-05 DIAGNOSIS — N401 Enlarged prostate with lower urinary tract symptoms: Secondary | ICD-10-CM

## 2017-05-05 DIAGNOSIS — N529 Male erectile dysfunction, unspecified: Secondary | ICD-10-CM

## 2017-05-05 DIAGNOSIS — E291 Testicular hypofunction: Secondary | ICD-10-CM | POA: Diagnosis not present

## 2017-05-05 DIAGNOSIS — E349 Endocrine disorder, unspecified: Secondary | ICD-10-CM | POA: Diagnosis not present

## 2017-05-05 DIAGNOSIS — N138 Other obstructive and reflux uropathy: Secondary | ICD-10-CM | POA: Diagnosis not present

## 2017-05-05 MED ORDER — TESTOSTERONE CYPIONATE 200 MG/ML IM SOLN: INTRAMUSCULAR | 1 refills | Status: DC

## 2017-05-05 MED ORDER — TESTOSTERONE CYPIONATE 200 MG/ML IM SOLN
200.0000 mg | Freq: Once | INTRAMUSCULAR | Status: AC
  Administered 2017-05-05: 200 mg via INTRAMUSCULAR

## 2017-05-05 MED ORDER — SILDENAFIL CITRATE 20 MG PO TABS: ORAL_TABLET | ORAL | 3 refills | Status: AC

## 2017-07-08 ENCOUNTER — Encounter: Payer: Self-pay | Admitting: *Deleted

## 2017-07-08 ENCOUNTER — Emergency Department: Payer: BLUE CROSS/BLUE SHIELD

## 2017-07-08 ENCOUNTER — Emergency Department
Admission: EM | Admit: 2017-07-08 | Discharge: 2017-07-08 | Disposition: A | Discharge status: Home / Self Care | Payer: BLUE CROSS/BLUE SHIELD | Attending: Emergency Medicine | Admitting: Emergency Medicine

## 2017-07-08 ENCOUNTER — Other Ambulatory Visit: Payer: Self-pay

## 2017-07-08 DIAGNOSIS — I10 Essential (primary) hypertension: Secondary | ICD-10-CM | POA: Diagnosis not present

## 2017-07-08 DIAGNOSIS — Z87891 Personal history of nicotine dependence: Secondary | ICD-10-CM | POA: Diagnosis not present

## 2017-07-08 DIAGNOSIS — M79661 Pain in right lower leg: Secondary | ICD-10-CM | POA: Insufficient documentation

## 2017-07-08 DIAGNOSIS — Z9049 Acquired absence of other specified parts of digestive tract: Secondary | ICD-10-CM | POA: Insufficient documentation

## 2017-07-08 DIAGNOSIS — R2241 Localized swelling, mass and lump, right lower limb: Secondary | ICD-10-CM | POA: Diagnosis present

## 2017-07-08 DIAGNOSIS — M79604 Pain in right leg: Secondary | ICD-10-CM

## 2017-07-08 MED ORDER — OXYCODONE-ACETAMINOPHEN 5-325 MG PO TABS: 1.0000 | ORAL_TABLET | Freq: Three times a day (TID) | ORAL | 0 refills | Status: DC | PRN

## 2017-07-08 NOTE — ED Notes (Signed)
Reviewed discharge instructions, follow-up care, and prescriptions with patient. Patient verbalized understanding of all information reviewed. Patient stable, with no distress noted at this time.    

## 2017-07-08 NOTE — ED Provider Notes (Signed)
[facility] Emergency Department Provider Note       Time seen: ----------------------------------------- 8:23 PM on 07/08/2017 -----------------------------------------   I have reviewed the triage vital signs and the nursing notes.  HISTORY   Chief Complaint Leg Pain    HPI Malvin JohnsCasey N Yip is a 42 y.o. male with a history of hypertension and low testosterone who presents to the ED for right calf swelling for the past week.  Patient reports for the past 2 days the pain has been constant.  Swelling was noted in triage here on arrival.  Patient denies pain with any pressure on the back of his calf or knee.  He denies fevers, chills, chest pain or shortness of breath.  He does take testosterone shots.  Past Medical History:  Diagnosis Date  . Benign enlargement of prostate   . Elevated blood pressure   . Erectile dysfunction   . Hypogonadism in male   . Over weight     Patient Active Problem List   Diagnosis Date Noted  . BPH with obstruction/lower urinary tract symptoms 09/10/2015  . Erectile dysfunction of organic origin 09/10/2015  . Hypogonadism in male 09/10/2015  . Hypotestosteronism 11/15/2014    Past Surgical History:  Procedure Laterality Date  . CHOLECYSTECTOMY    . KNEE ARTHROSCOPY Right    1990  . thrombosis of leg      Allergies Patient has no known allergies.  Social History Social History   Tobacco Use  . Smoking status: Former Games developermoker  . Smokeless tobacco: Never Used  . Tobacco comment: quit 18 years ago  Substance Use Topics  . Alcohol use: Yes    Alcohol/week: 0.0 oz    Comment: occasionally  . Drug use: No    Review of Systems Constitutional: Negative for fever. Cardiovascular: Negative for chest pain. Respiratory: Negative for shortness of breath. Gastrointestinal: Negative for abdominal pain, vomiting and diarrhea. Musculoskeletal: Positive for right calf pain Skin: Negative for rash. Neurological:  Negative for headaches, focal weakness or numbness.  All systems negative/normal/unremarkable except as stated in the HPI  ____________________________________________   PHYSICAL EXAM:  VITAL SIGNS: ED Triage Vitals  Enc Vitals Group     BP 07/08/17 1953 (!) 178/87     Pulse Rate 07/08/17 1953 83     Resp 07/08/17 1953 20     Temp 07/08/17 1953 98.6 F (37 C)     Temp Source 07/08/17 1953 Oral     SpO2 07/08/17 1953 98 %     Weight 07/08/17 1954 248 lb (112.5 kg)     Height 07/08/17 1954 5\' 10"  (1.778 m)     Head Circumference --      Peak Flow --      Pain Score 07/08/17 1956 6     Pain Loc --      Pain Edu? --      Excl. in GC? --    Constitutional: Alert and oriented. Well appearing and in no distress. Eyes: Conjunctivae are normal. Normal extraocular movements. Cardiovascular: Normal rate, regular rhythm. No murmurs, rubs, or gallops. Respiratory: Normal respiratory effort without tachypnea nor retractions. Breath sounds are clear and equal bilaterally. No wheezes/rales/rhonchi. Gastrointestinal: Soft and nontender. Normal bowel sounds Musculoskeletal: No obvious edema or swelling in the right leg or calf compared to the left.  Nontender. Neurologic:  Normal speech and language. No gross focal neurologic deficits are appreciated.  Skin:  Skin is warm, dry and intact. No rash noted. ____________________________________________  ED COURSE:  As part of my medical decision making, I reviewed the following data within the electronic MEDICAL RECORD NUMBER History obtained from family if available, nursing notes, old chart and ekg, as well as notes from prior ED visits. Patient presented for right calf pain and swelling, we will assess with imaging as indicated at this time.   Procedures ____________________________________________   RADIOLOGY Images were viewed by me  Right lower extremity ultrasound Does not reveal any  DVT ____________________________________________  DIFFERENTIAL DIAGNOSIS   Muscle strain, ruptured Baker's cyst, DVT, cellulitis  FINAL ASSESSMENT AND PLAN  Calf pain   Plan: Patient had presented for Pain and swelling. Patient's imaging was grossly unremarkable.  He be discharged with NSAIDs and is stable for outpatient follow-up.   Emily Filbert, MD   Note: This note was generated in part or whole with voice recognition software. Voice recognition is usually quite accurate but there are transcription errors that can and very often do occur. I apologize for any typographical errors that were not detected and corrected.     Emily Filbert, MD 07/08/17 2045

## 2017-07-08 NOTE — ED Triage Notes (Signed)
Pain in right calf x 1 week and pt reports for the past 2 days pain has been constant. Swelling noted. Pt denies pain when pressure is applied to calf and back pf knee. No color change noted. Leg is slightly warm to the touch at this time.

## 2017-08-05 ENCOUNTER — Other Ambulatory Visit: Payer: BLUE CROSS/BLUE SHIELD

## 2017-08-05 ENCOUNTER — Other Ambulatory Visit: Payer: Self-pay

## 2017-08-05 DIAGNOSIS — E291 Testicular hypofunction: Secondary | ICD-10-CM

## 2017-08-06 LAB — TESTOSTERONE: TESTOSTERONE: 851 ng/dL (ref 264–916)

## 2017-08-06 LAB — HEMATOCRIT: HEMATOCRIT: 50.5 % (ref 37.5–51.0)

## 2017-08-06 LAB — HEMOGLOBIN: HEMOGLOBIN: 17.8 g/dL — AB (ref 13.0–17.7)

## 2017-08-08 ENCOUNTER — Telehealth: Payer: Self-pay

## 2017-08-08 NOTE — Telephone Encounter (Signed)
-----   Message from Harle BattiestShannon A McGowan, PA-C sent at 08/07/2017  8:25 PM EST ----- Please let Mr. James Hebert know that his testosterone level is at a therapeutic levels, but his Hbg and HCT are elevated.  While testosterone injection can certainly increase these lab values, so can not sleeping with his CPAP machine.  Is he sleeping with his CPAP machine nightly?

## 2017-08-08 NOTE — Telephone Encounter (Signed)
Spoke with pt in reference to lab results. Pt stated that he is sleeping with his CPAP machine. Pt stated that he would go donate blood.

## 2017-11-02 NOTE — Progress Notes (Deleted)
11/03/2017 1:25 PM   James Hebert 04-Jan-1976 [id number]  Referring provider: Ignacia Bayley, PA-C [address] [facility] [place], [place] [address]  No chief complaint on file.   HPI: Patient is a 42 year old Caucasian male who presents today for a 6 month follow up for testosterone deficiency, erectile dysfunction and BPH with LUTS.  Testosterone deficiency He is still having spontaneous erections at night.  He has sleep apnea and is sleeping with a CPAP machine.  His last testosterone was 851 ng/dL in 95/2841.  Hbg was elevated and patient stated he would donate blood.  He did not follow up for repeat blood work.   He is currently managing his testosterone deficiency with testosterone cypionate 200 mg IM every week.  He has been self administering his testosterone.       Erectile dysfunction His SHIM score is ***, which is *** ED.   His previous SHIM score was ***.  He has been having difficulty with erections for many years.   His major complaint is achieving an erection.  His libido is preserved.   His risk factors for ED are age, BPH, testosterone deficiency, HTN and HLD.  He denies any painful erections or curvatures with his erections.   He has tried sildenafil in the past with good results.    Score: 1-7 Severe ED 8-11 Moderate ED 12-16 Mild-Moderate ED 17-21 Mild ED 22-25 No ED    BPH WITH LUTS His IPSS score today is ***, which is *** lower urinary tract symptomatology. He is *** with his quality life due to his urinary symptoms.  His previous IPSS score was 4/2.  His major complaint today incompletely emptying, frequency and nocturia, but these are no bothersome to him.  He has had these symptoms for several years.  He denies any dysuria, hematuria or suprapubic pain.   He also denies any recent fevers, chills, nausea or vomiting.  He does not have a family history of PCa.    Score:  1-7 Mild 8-19 Moderate 20-35  Severe      PMH: Past Medical History:  Diagnosis Date  . Benign enlargement of prostate   . Elevated blood pressure   . Erectile dysfunction   . Hypogonadism in male   . Over weight     Surgical History: Past Surgical History:  Procedure Laterality Date  . CHOLECYSTECTOMY    . KNEE ARTHROSCOPY Right    1990  . thrombosis of leg      Home Medications:  Allergies as of 11/03/2017   No Known Allergies     Medication List        Accurate as of 11/02/17  1:25 PM. Always use your most recent med list.          oxyCODONE-acetaminophen 5-325 MG tablet Commonly known as:  PERCOCET Take 1-2 tablets by mouth every 8 (eight) hours as needed.   sildenafil 20 MG tablet Commonly known as:  REVATIO Take 3 to 5 tablets two hours before intercouse on an empty stomach.  Do not take with nitrates.   testosterone cypionate 200 MG/ML injection Commonly known as:  DEPOTESTOSTERONE CYPIONATE Inject 1mL every 7 days       Allergies: No Known Allergies  Family History: Family History  Problem Relation Age of Onset  . Prostate cancer Neg Hx   . Bladder Cancer Neg Hx   . Kidney cancer Neg Hx     Social History:  reports that he has  quit smoking. He has never used smokeless tobacco. He reports that he drinks alcohol. He reports that he does not use drugs.  ROS:                                        Physical Exam: There were no vitals taken for this visit.  Constitutional: Well nourished. Alert and oriented, No acute distress. HEENT: [place] AT, moist mucus membranes. Trachea midline, no masses. Cardiovascular: No clubbing, cyanosis, or edema. Respiratory: Normal respiratory effort, no increased work of breathing. GI: Abdomen is soft, non tender, non distended, no abdominal masses. Liver and spleen not palpable.  No hernias appreciated.  Stool sample for occult testing is not indicated.   GU: No CVA tenderness.  No bladder fullness or masses.  Patient  with circumcised phallus.  Urethral meatus is patent.  No penile discharge. No penile lesions or rashes. Scrotum without lesions, cysts, rashes and/or edema.  Testicles are located scrotally bilaterally. No masses are appreciated in the testicles. Left and right epididymis are normal. Rectal: Patient with  normal sphincter tone. Anus and perineum without scarring or rashes. No rectal masses are appreciated. Prostate is approximately 35 grams, no nodules are appreciated. Seminal vesicles are normal. Skin: No rashes, bruises or suspicious lesions. Lymph: No cervical or inguinal adenopathy. Neurologic: Grossly intact, no focal deficits, moving all 4 extremities. Psychiatric: Normal mood and affect.  Constitutional: Well nourished. Alert and oriented, No acute distress. HEENT: [place] AT, moist mucus membranes. Trachea midline, no masses. Cardiovascular: No clubbing, cyanosis, or edema. Respiratory: Normal respiratory effort, no increased work of breathing. GI: Abdomen is soft, non tender, non distended, no abdominal masses. Liver and spleen not palpable.  No hernias appreciated.  Stool sample for occult testing is not indicated.   GU: No CVA tenderness.  No bladder fullness or masses.  Patient with circumcised/uncircumcised phallus. ***Foreskin easily retracted***  Urethral meatus is patent.  No penile discharge. No penile lesions or rashes. Scrotum without lesions, cysts, rashes and/or edema.  Testicles are located scrotally bilaterally. No masses are appreciated in the testicles. Left and right epididymis are normal. Rectal: Patient with  normal sphincter tone. Anus and perineum without scarring or rashes. No rectal masses are appreciated. Prostate is approximately *** grams, *** nodules are appreciated. Seminal vesicles are normal. Skin: No rashes, bruises or suspicious lesions. Lymph: No cervical or inguinal adenopathy. Neurologic: Grossly intact, no focal deficits, moving all 4  extremities. Psychiatric: Normal mood and affect.   Laboratory Data: PSA History  0.4 ng/mL on 03/23/2017 Lab Results  Component Value Date   WBC 5.4 10/08/2016   HGB 17.8 (H) 08/05/2017   HCT 50.5 08/05/2017   MCV 88.9 10/08/2016   PLT 175 10/08/2016    Lab Results  Component Value Date   TESTOSTERONE 851 08/05/2017   I have reviewed the labs.    Assessment & Plan:    1.  Testosterone deficiency:     -most recent testosterone level is 851 ng/dL on 16/03/9603 (therapeutic levels 450 -600)  -continue testosterone cypionate injections, 200 mg IM q week; refills given  - RTC in 3 months for HCT, HBG and testosterone one week after injection - if blood work is stable  - RTC in 6 months for HCT, HBG, testosterone one week after injection, PSA and exam  2. BPH with LUTS  - IPSS score is 4/2, it is worsening  -  Continue conservative management, avoiding bladder irritants and timed voiding's  - RTC in 6 months for IPSS, PSA and exam, as testosterone therapy can cause prostate enlargement and worsen LU TS - if blood work is stable  3. Erectile dysfunction:     -SHIM score is 16, it is improved  -continue sildenafil 20 mg; refills given  -RTC in 6 months for SHIM score and exam, as testosterone therapy can affect erections  No follow-ups on file.  These notes generated with voice recognition software. I apologize for typographical errors.  Michiel Cowboy, PA-C  [facility] [address] [place], [place] [address] [phone]

## 2017-11-03 ENCOUNTER — Ambulatory Visit: Payer: BLUE CROSS/BLUE SHIELD | Admitting: Urology

## 2017-11-03 ENCOUNTER — Other Ambulatory Visit: Payer: BLUE CROSS/BLUE SHIELD

## 2017-11-03 ENCOUNTER — Other Ambulatory Visit: Payer: Self-pay

## 2017-11-03 DIAGNOSIS — N401 Enlarged prostate with lower urinary tract symptoms: Secondary | ICD-10-CM

## 2017-11-03 DIAGNOSIS — E291 Testicular hypofunction: Secondary | ICD-10-CM

## 2017-11-03 DIAGNOSIS — E349 Endocrine disorder, unspecified: Secondary | ICD-10-CM

## 2017-11-03 DIAGNOSIS — N138 Other obstructive and reflux uropathy: Secondary | ICD-10-CM

## 2017-11-04 LAB — PSA: Prostate Specific Ag, Serum: 0.5 ng/mL (ref 0.0–4.0)

## 2017-11-04 LAB — HEMATOCRIT: Hematocrit: 47.9 % (ref 37.5–51.0)

## 2017-11-04 LAB — TESTOSTERONE: Testosterone: 739 ng/dL (ref 264–916)

## 2017-11-04 LAB — HEMOGLOBIN: HEMOGLOBIN: 16.4 g/dL (ref 13.0–17.7)

## 2017-11-10 ENCOUNTER — Other Ambulatory Visit: Payer: Self-pay | Admitting: Urology

## 2017-11-10 DIAGNOSIS — E291 Testicular hypofunction: Secondary | ICD-10-CM

## 2017-11-10 NOTE — Telephone Encounter (Signed)
Pt called and wants to know if Rx for testosterone was sent.  Pt wants to change from [facility] to [facility].  He would like a 3 month supply.  Pt is completely out.

## 2017-11-16 MED ORDER — TESTOSTERONE CYPIONATE 200 MG/ML IM SOLN: INTRAMUSCULAR | 0 refills | Status: DC

## 2017-11-16 NOTE — Telephone Encounter (Signed)
-----   Message from Harle Battiest, PA-C sent at 11/15/2017 11:32 AM EDT ----- We can fill his testosterone cypionate for one month.  I will refill the testosterone cypionate for three months at his office visit on 06/20.

## 2017-11-16 NOTE — Telephone Encounter (Signed)
Attempted to reach pt to inform, no answer on phone.

## 2017-11-16 NOTE — Telephone Encounter (Signed)
Script printed.

## 2017-11-16 NOTE — Telephone Encounter (Signed)
Patient called back and said if we could call his script into the [facility] on [address] please.  Thanks, Marcelino Duster

## 2017-11-18 NOTE — Telephone Encounter (Signed)
Patient called the office today.  [facility] is too expensive for the medication.  Can you please send to [facility]?  Will also need a new prior authorization.

## 2017-11-18 NOTE — Addendum Note (Signed)
Addended by: Honor Loh on: 11/18/2017 09:50 AM   Modules accepted: Orders

## 2017-11-18 NOTE — Telephone Encounter (Signed)
Called [facility] and spoke to the pharmacist Kathlene November he cancelled the Testosterone RX. Testosteroned was re printed and faxed to [employer].

## 2017-11-22 ENCOUNTER — Telehealth: Payer: Self-pay

## 2017-11-22 NOTE — Telephone Encounter (Signed)
Prior Authorization for testosterone Cypionate received, submitted via fax, approved through 11/22/2018

## 2017-12-07 NOTE — Progress Notes (Signed)
12/08/2017 3:40 PM   James Hebert September 17, 1975 [id number]  Referring provider: Ignacia Bayley, PA-C [address] [place], [place] [address]  Chief Complaint  Patient presents with  . Hypogonadism    HPI: Patient is a 42 year old Caucasian male who presents today for a 6 month follow up for testosterone deficiency, erectile dysfunction and BPH with LUTS.  Testosterone deficiency Patient is experiencing a decrease in libido, lack of energy, a decrease in strength and/or endurance, erections less strong and falling asleep after dinner.  This is indicated by his responses to the ADAM questionnaire.  He is still having spontaneous erections at night.  He has sleep apnea and is sleeping with a CPAP machine.  His last testosterone was 739 ng/dL in 19/1478.  Hbg was 16.4 and HCT was 47.9% in 10/2017.  He is currently managing his testosterone deficiency with testosterone cypionate 200 mg IM every week.  He has been self administering his testosterone.      Erectile dysfunction His SHIM score is 19, which is mild ED.   His previous SHIM score was 16.  He has been having difficulty with erections for over 7 years.   His major complaint is achieving an erection.  His libido is preserved.   His risk factors for ED are age, BPH, testosterone deficiency, HTN and HLD.  He denies any painful erections or curvatures with his erections.   He has tried sildenafil in the past with good results. SHIM    Row Name 12/08/17 1535         SHIM: Over the last 6 months:   How do you rate your confidence that you could get and keep an erection?  Moderate     When you had erections with sexual stimulation, how often were your erections hard enough for penetration (entering your partner)?  Most Times (much more than half the time)     During sexual intercourse, how often were you able to maintain your erection after you had penetrated (entered) your partner?  Most Times (much  more than half the time)     During sexual intercourse, how difficult was it to maintain your erection to completion of intercourse?  Slightly Difficult     When you attempted sexual intercourse, how often was it satisfactory for you?  Most Times (much more than half the time)       SHIM Total Score   SHIM  19        Score: 1-7 Severe ED 8-11 Moderate ED 12-16 Mild-Moderate ED 17-21 Mild ED 22-25 No ED    BPH WITH LUTS He has no urinary complaints at this time.   PMH: Past Medical History:  Diagnosis Date  . Benign enlargement of prostate   . Elevated blood pressure   . Erectile dysfunction   . Hypogonadism in male   . Over weight     Surgical History: Past Surgical History:  Procedure Laterality Date  . CHOLECYSTECTOMY    . KNEE ARTHROSCOPY Right    1990  . thrombosis of leg      Home Medications:  Allergies as of 12/08/2017   No Known Allergies     Medication List        Accurate as of 12/08/17  3:40 PM. Always use your most recent med list.          oxyCODONE-acetaminophen 5-325 MG tablet Commonly known as:  PERCOCET Take 1-2 tablets by mouth every 8 (eight)  hours as needed.   sildenafil 20 MG tablet Commonly known as:  REVATIO Take 3 to 5 tablets two hours before intercouse on an empty stomach.  Do not take with nitrates.   testosterone cypionate 200 MG/ML injection Commonly known as:  DEPOTESTOSTERONE CYPIONATE Inject 1mL every 7 days   TOPROL XL 100 MG 24 hr tablet Generic drug:  metoprolol succinate       Allergies: No Known Allergies  Family History: Family History  Problem Relation Age of Onset  . Prostate cancer Neg Hx   . Bladder Cancer Neg Hx   . Kidney cancer Neg Hx     Social History:  reports that he has quit smoking. He has never used smokeless tobacco. He reports that he drinks alcohol. He reports that he does not use drugs.  ROS: UROLOGY Frequent Urination?: No Hard to postpone urination?: No Burning/pain with  urination?: No Get up at night to urinate?: No Leakage of urine?: No Urine stream starts and stops?: No Trouble starting stream?: No Do you have to strain to urinate?: No Blood in urine?: No Urinary tract infection?: No Sexually transmitted disease?: No Injury to kidneys or bladder?: No Painful intercourse?: No Weak stream?: No Erection problems?: No Penile pain?: No  Gastrointestinal Nausea?: No Vomiting?: No Indigestion/heartburn?: No Diarrhea?: No Constipation?: No  Constitutional Fever: No Night sweats?: No Weight loss?: No Fatigue?: No  Skin Skin rash/lesions?: No Itching?: No  Eyes Blurred vision?: No Double vision?: No  Ears/Nose/Throat Sore throat?: No Sinus problems?: No  Hematologic/Lymphatic Swollen glands?: No Easy bruising?: No  Cardiovascular Leg swelling?: No Chest pain?: No  Respiratory Cough?: No Shortness of breath?: No  Endocrine Excessive thirst?: No  Musculoskeletal Back pain?: Yes Joint pain?: No  Neurological Headaches?: No Dizziness?: No  Psychologic Depression?: No Anxiety?: No  Physical Exam: BP (!) 184/104   Pulse 68   Resp 16   Ht 5\' 10"  (1.778 m)   Wt 270 lb (122.5 kg)   SpO2 99%   BMI 38.74 kg/m   Constitutional: Well nourished. Alert and oriented, No acute distress. HEENT: [place] AT, moist mucus membranes. Trachea midline, no masses. Cardiovascular: No clubbing, cyanosis, or edema. Respiratory: Normal respiratory effort, no increased work of breathing. GI: Abdomen is soft, non tender, non distended, no abdominal masses. Liver and spleen not palpable.  No hernias appreciated.  Stool sample for occult testing is not indicated.   GU: No CVA tenderness.  No bladder fullness or masses.  Patient with circumcised phallus.  Urethral meatus is patent.  No penile discharge. No penile lesions or rashes. Scrotum without lesions, cysts, rashes and/or edema.  Testicles are located scrotally bilaterally. No masses are  appreciated in the testicles. Left and right epididymis are normal. Rectal: Patient with  normal sphincter tone. Anus and perineum without scarring or rashes. No rectal masses are appreciated. Prostate is approximately 35 grams, no nodules are appreciated. Seminal vesicles are normal. Skin: No rashes, bruises or suspicious lesions. Lymph: No cervical or inguinal adenopathy. Neurologic: Grossly intact, no focal deficits, moving all 4 extremities. Psychiatric: Normal mood and affect.   Laboratory Data: PSA History  0.4 ng/mL on 03/23/2017  0.5 in 10/2017 Lab Results  Component Value Date   WBC 5.4 10/08/2016   HGB 16.4 11/03/2017   HCT 47.9 11/03/2017   MCV 88.9 10/08/2016   PLT 175 10/08/2016    Lab Results  Component Value Date   TESTOSTERONE 739 11/03/2017   I have reviewed the labs.  Assessment & Plan:    1.  Testosterone deficiency:     -most recent testosterone level is 739 ng/dL on 63/87/564310/08/2016 (therapeutic levels 450 -600)  -continue testosterone cypionate injections, 200 mg IM q week; refills given  - RTC in 3 months for HCT, HBG and testosterone one week after injection  - RTC in 6 months for HCT, HBG, testosterone one week after injection, PSA and exam  2. BPH with LUTS  - Continue conservative management, avoiding bladder irritants and timed voiding's  - RTC in 6 months for IPSS, PSA and exam, as testosterone therapy can cause prostate enlargement and worsen LUTS  3. Erectile dysfunction:     -SHIM score is 19, it is improved  -continue sildenafil 20 mg; refills given  -RTC in 6 months for SHIM score and exam, as testosterone therapy can affect erections  Return in about 3 months (around 03/10/2018) for  testosterone (one week after injection) H & H.  These notes generated with voice recognition software. I apologize for typographical errors.  Michiel CowboySHANNON Michala Deblanc, PA-C  [facility] [address] [place], [place]  [id number] [phone]

## 2017-12-08 ENCOUNTER — Ambulatory Visit (INDEPENDENT_AMBULATORY_CARE_PROVIDER_SITE_OTHER): Payer: BLUE CROSS/BLUE SHIELD | Admitting: Urology

## 2017-12-08 ENCOUNTER — Encounter: Payer: Self-pay | Admitting: Urology

## 2017-12-08 VITALS — BP 184/104 | HR 68 | Resp 16 | Ht 70.0 in | Wt 270.0 lb

## 2017-12-08 DIAGNOSIS — N138 Other obstructive and reflux uropathy: Secondary | ICD-10-CM

## 2017-12-08 DIAGNOSIS — N529 Male erectile dysfunction, unspecified: Secondary | ICD-10-CM | POA: Diagnosis not present

## 2017-12-08 DIAGNOSIS — E349 Endocrine disorder, unspecified: Secondary | ICD-10-CM | POA: Diagnosis not present

## 2017-12-08 DIAGNOSIS — E291 Testicular hypofunction: Secondary | ICD-10-CM | POA: Diagnosis not present

## 2017-12-08 DIAGNOSIS — N401 Enlarged prostate with lower urinary tract symptoms: Secondary | ICD-10-CM

## 2017-12-08 MED ORDER — TESTOSTERONE CYPIONATE 200 MG/ML IM SOLN: INTRAMUSCULAR | 0 refills | Status: DC

## 2018-02-06 DIAGNOSIS — M545 Low back pain, unspecified: Secondary | ICD-10-CM | POA: Insufficient documentation

## 2018-03-10 ENCOUNTER — Other Ambulatory Visit: Payer: BLUE CROSS/BLUE SHIELD

## 2018-03-16 ENCOUNTER — Other Ambulatory Visit: Payer: Self-pay | Admitting: Family Medicine

## 2018-03-16 DIAGNOSIS — E349 Endocrine disorder, unspecified: Secondary | ICD-10-CM

## 2018-03-17 ENCOUNTER — Other Ambulatory Visit: Payer: BLUE CROSS/BLUE SHIELD

## 2018-03-17 ENCOUNTER — Telehealth: Payer: Self-pay | Admitting: Urology

## 2018-03-17 DIAGNOSIS — E349 Endocrine disorder, unspecified: Secondary | ICD-10-CM

## 2018-03-17 NOTE — Telephone Encounter (Signed)
Can you please send RX on [facility] per patient?

## 2018-03-17 NOTE — Telephone Encounter (Signed)
We prescribe him testosterone cypionate and sildenafil.  Which one does he want [place] to send to [facility]?

## 2018-03-18 LAB — HEMOGLOBIN: Hemoglobin: 17.5 g/dL (ref 13.0–17.7)

## 2018-03-18 LAB — HEMATOCRIT: Hematocrit: 51.5 % — ABNORMAL HIGH (ref 37.5–51.0)

## 2018-03-18 LAB — TESTOSTERONE: TESTOSTERONE: 677 ng/dL (ref 264–916)

## 2018-03-20 ENCOUNTER — Telehealth: Payer: Self-pay | Admitting: Family Medicine

## 2018-03-20 ENCOUNTER — Other Ambulatory Visit: Payer: Self-pay | Admitting: Family Medicine

## 2018-03-20 DIAGNOSIS — E291 Testicular hypofunction: Secondary | ICD-10-CM

## 2018-03-20 MED ORDER — TESTOSTERONE CYPIONATE 200 MG/ML IM SOLN: INTRAMUSCULAR | 0 refills | Status: DC

## 2018-03-20 NOTE — Telephone Encounter (Signed)
-----   Message from Harle Battiest, PA-C sent at 03/19/2018  8:00 PM EDT ----- Please let Mr. James Hebert know that his blood work is normal.  We will see him in December.

## 2018-03-20 NOTE — Telephone Encounter (Signed)
Patient notified, needs a refill on Testosterone. RX printed to be [facility] to [facility].

## 2018-06-09 ENCOUNTER — Telehealth: Payer: Self-pay | Admitting: Urology

## 2018-06-09 ENCOUNTER — Ambulatory Visit (INDEPENDENT_AMBULATORY_CARE_PROVIDER_SITE_OTHER): Payer: BLUE CROSS/BLUE SHIELD | Admitting: Urology

## 2018-06-09 ENCOUNTER — Encounter: Payer: Self-pay | Admitting: Urology

## 2018-06-09 VITALS — BP 179/80 | HR 67 | Ht 70.0 in | Wt 276.1 lb

## 2018-06-09 DIAGNOSIS — N138 Other obstructive and reflux uropathy: Secondary | ICD-10-CM

## 2018-06-09 DIAGNOSIS — N401 Enlarged prostate with lower urinary tract symptoms: Secondary | ICD-10-CM

## 2018-06-09 DIAGNOSIS — N529 Male erectile dysfunction, unspecified: Secondary | ICD-10-CM

## 2018-06-09 DIAGNOSIS — E291 Testicular hypofunction: Secondary | ICD-10-CM | POA: Diagnosis not present

## 2018-06-09 DIAGNOSIS — E349 Endocrine disorder, unspecified: Secondary | ICD-10-CM | POA: Diagnosis not present

## 2018-06-09 MED ORDER — TESTOSTERONE CYPIONATE 200 MG/ML IM SOLN: INTRAMUSCULAR | 0 refills | Status: DC

## 2018-06-09 NOTE — Telephone Encounter (Signed)
Rx faxed to [facility]

## 2018-06-09 NOTE — Telephone Encounter (Signed)
Pt saw Carollee HerterShannon this morning and Testosterone should have been sent to [facility], NOT [facility].  [facility]

## 2018-06-09 NOTE — Progress Notes (Signed)
06/09/2018 10:17 AM   James Hebert 05/11/1976 [id number]  Referring provider: No referring provider defined for this encounter.  Chief Complaint  Patient presents with  . Hypogonadism    HPI: Patient is a 42 year old Caucasian male with a history of BPH with LUTS and erectile dysfunction returns today for a 3 month follow up for testosterone deficiency. He reports of doing well overall.    Testosterone deficiency On 12/08/2017, patient wasexperiencing a decrease in libido, lack of energy, a decrease in strength and/or endurance, erections less strong and falling asleep after dinner.  This is indicated by his responses to the ADAM questionnaire.  He was still having spontaneous erections at night.  He had sleep apnea and was sleeping with a CPAP machine.  His last testosterone was 677 ng/dL in 4/09/81199/27/2019.  Hbg was 17.5 and HCT was 51.5% on 03/17/2018.  He was managing his testosterone deficiency with testosterone cypionate 200 mg IM every week.  He had been self administering his testosterone.       He reports of 1cc testosterone injections going well. Pt still reports of sleeping with CPAP machine.     Erectile dysfunction His SHIM score is 17, which is mild ED.   His previous SHIM score was 19.  He has been having difficulty with erections for over 7 years.   His major complaint is achieving an erection.  His libido is preserved.   His risk factors for ED are age, BPH, testosterone deficiency, HTN and HLD.  He denies any painful erections or curvatures with his erections.    SHIM    Row Name 06/09/18 [phone]         SHIM: Over the last 6 months:   How do you rate your confidence that you could get and keep an erection?  Moderate     When you had erections with sexual stimulation, how often were your erections hard enough for penetration (entering your partner)?  Most Times (much more than half the time)     During sexual intercourse, how often were you able to maintain your  erection after you had penetrated (entered) your partner?  Sometimes (about half the time)     During sexual intercourse, how difficult was it to maintain your erection to completion of intercourse?  Slightly Difficult     When you attempted sexual intercourse, how often was it satisfactory for you?  Sometimes (about half the time)       SHIM Total Score   SHIM  17        He reports that he has split with his wife for a year so he has not used sildenafil.    Score: 1-7 Severe ED 8-11 Moderate ED 12-16 Mild-Moderate ED 17-21 Mild ED 22-25 No ED  BPH WITH LUTS He has no urinary complaints at this time. Patient denies any gross hematuria, dysuria or suprapubic/flank pain.  Patient denies any fevers, chills, nausea or vomiting. His I PSS today is 3/2.   IPSS    Row Name 06/09/18 0900         International Prostate Symptom Score   How often have you had the sensation of not emptying your bladder?  Less than 1 in 5     How often have you had to urinate less than every two hours?  Less than 1 in 5 times     How often have you found you stopped and started again several times when you urinated?  Not at All     How often have you found it difficult to postpone urination?  Not at All     How often have you had a weak urinary stream?  Not at All     How often have you had to strain to start urination?  Not at All     How many times did you typically get up at night to urinate?  1 Time     Total IPSS Score  3       Quality of Life due to urinary symptoms   If you were to spend the rest of your life with your urinary condition just the way it is now how would you feel about that?  Mostly Satisfied        Score:  1-7 Mild 8-19 Moderate 20-35 Severe   PMH: Past Medical History:  Diagnosis Date  . Benign enlargement of prostate   . Elevated blood pressure   . Erectile dysfunction   . Hypogonadism in male   . Over weight     Surgical History: Past Surgical History:    Procedure Laterality Date  . CHOLECYSTECTOMY    . KNEE ARTHROSCOPY Right    1990  . thrombosis of leg      Home Medications:  Allergies as of 06/09/2018   No Known Allergies     Medication List       Accurate as of June 09, 2018 10:17 AM. Always use your most recent med list.        oxyCODONE-acetaminophen 5-325 MG tablet Commonly known as:  PERCOCET Take 1-2 tablets by mouth every 8 (eight) hours as needed.   sildenafil 20 MG tablet Commonly known as:  REVATIO Take 3 to 5 tablets two hours before intercouse on an empty stomach.  Do not take with nitrates.   testosterone cypionate 200 MG/ML injection Commonly known as:  DEPOTESTOSTERONE CYPIONATE Inject 1mL every 7 days   TOPROL XL 100 MG 24 hr tablet Generic drug:  metoprolol succinate       Allergies: No Known Allergies  Family History: Family History  Problem Relation Age of Onset  . Prostate cancer Neg Hx   . Bladder Cancer Neg Hx   . Kidney cancer Neg Hx     Social History:  reports that he has quit smoking. He has never used smokeless tobacco. He reports current alcohol use. He reports that he does not use drugs.  ROS: UROLOGY Frequent Urination?: No Hard to postpone urination?: No Burning/pain with urination?: No Get up at night to urinate?: No Leakage of urine?: No Urine stream starts and stops?: No Trouble starting stream?: No Do you have to strain to urinate?: No Blood in urine?: No Urinary tract infection?: No Sexually transmitted disease?: No Injury to kidneys or bladder?: No Painful intercourse?: No Weak stream?: No Erection problems?: No Penile pain?: No  Gastrointestinal Nausea?: No Vomiting?: No Indigestion/heartburn?: No Diarrhea?: No Constipation?: No  Constitutional Fever: No Night sweats?: No Weight loss?: No Fatigue?: No  Skin Skin rash/lesions?: No Itching?: No  Eyes Blurred vision?: No Double vision?: No  Ears/Nose/Throat Sore throat?: No Sinus  problems?: No  Hematologic/Lymphatic Swollen glands?: No Easy bruising?: No  Cardiovascular Leg swelling?: No Chest pain?: No  Respiratory Cough?: No Shortness of breath?: No  Endocrine Excessive thirst?: No  Musculoskeletal Back pain?: No Joint pain?: No  Neurological Headaches?: No Dizziness?: No  Psychologic Depression?: No Anxiety?: No  Physical Exam: BP (!) 179/80 (BP Location: Left  Arm, Patient Position: Sitting, Cuff Size: Large)   Pulse 67   Ht 5\' 10"  (1.778 m)   Wt 276 lb 1.6 oz (125.2 kg)   BMI 39.62 kg/m   Constitutional: Well nourished. Alert and oriented, No acute distress. HEENT: [place] AT, moist mucus membranes. Trachea midline, no masses. Cardiovascular: No clubbing, cyanosis, or edema. Respiratory: Normal respiratory effort, no increased work of breathing. GU: No CVA tenderness.  No bladder fullness or masses.  Patient with circumcised .Urethral meatus is patent.  No penile discharge. No penile lesions or rashes. Scrotum without lesions, cysts, rashes and/or edema.  Testicles are located scrotally bilaterally. No masses are appreciated in the testicles. Left and right epididymis are normal. Rectal: Patient with  normal sphincter tone. Anus and perineum without scarring or rashes. No rectal masses are appreciated. Prostate is approximately 45 grams, no nodules are appreciated. Seminal vesicles are normal. Skin: No rashes, bruises or suspicious lesions. Neurologic: Grossly intact, no focal deficits, moving all 4 extremities. Psychiatric: Normal mood and affect.  Laboratory Data: PSA History  0.4 ng/mL on 03/23/2017  0.5 in 10/2017  Lab Results  Component Value Date   TESTOSTERONE 677 03/17/2018   I have reviewed the labs.  Assessment & Plan:    1.  Testosterone deficiency:     -most recent testosterone level is 677 ng/dL on 16/03/9603  (therapeutic levels 450 -600)  -continue testosterone cypionate injections, 200 mg IM q week; refills  given  - RTC in 3 months for HCT, HBG and testosterone one week after injection  - RTC in 6 months for HCT, HBG, testosterone one week after injection, PSA and exam  2. BPH with LUTS  - Continue conservative management, avoiding bladder irritants and timed voiding's   - PSA taken today - pending   - RTC in 6 months for IPSS, PSA and exam, as testosterone therapy can cause prostate enlargement and worsen LUTS  3. Erectile dysfunction:     -SHIM score is 17, it is improved  -continue sildenafil 20 mg; pt separated from wife a year ago  -RTC in 6 months for SHIM score and exam, as testosterone therapy can affect erections  Return in about 3 months (around 09/08/2018) for HCT, HBG, tesosterone .  These notes generated with voice recognition software. I apologize for typographical errors.  Michiel Cowboy, PA-C  [facility] [address] [place], [place] [address] [phone]  I, Donne Hazel, am acting as a Neurosurgeon for [employer],  I have reviewed the above documentation for accuracy and completeness, and I agree with the above.    Michiel Cowboy, PA-C

## 2018-06-10 LAB — HEMOGLOBIN: Hemoglobin: 18.3 g/dL — ABNORMAL HIGH (ref 13.0–17.7)

## 2018-06-10 LAB — PSA: PROSTATE SPECIFIC AG, SERUM: 0.4 ng/mL (ref 0.0–4.0)

## 2018-06-10 LAB — HEMATOCRIT: Hematocrit: 52.3 % — ABNORMAL HIGH (ref 37.5–51.0)

## 2018-06-10 LAB — TESTOSTERONE: TESTOSTERONE: 1113 ng/dL — AB (ref 264–916)

## 2018-06-12 ENCOUNTER — Telehealth: Payer: Self-pay | Admitting: Family Medicine

## 2018-06-12 NOTE — Telephone Encounter (Signed)
Patient notified and voiced understanding. He will call back to schedule the lab appointment.

## 2018-06-12 NOTE — Telephone Encounter (Signed)
-----   Message from Harle BattiestShannon A McGowan, PA-C sent at 06/12/2018  7:53 AM EST ----- Please let Mr. James RoughenKirkman know that his testosterone is elevated.  He needs to stop his injections this week.  He will then need to restart his injection at 0.5 cc every week and then we need to repeat his testosterone level, HCT and Hgb after his fourth injection.

## 2018-06-30 ENCOUNTER — Telehealth: Payer: Self-pay | Admitting: Urology

## 2018-06-30 NOTE — Telephone Encounter (Signed)
LMOM FOR PT TO CALL OFFICE TO SCHEDULE LABWORK

## 2018-07-04 NOTE — Telephone Encounter (Signed)
-----   Message from Harle Battiest, PA-C sent at 06/22/2018  8:59 PM EST ----- Would you call James Hebert and get him scheduled for a lab appointment the week of January 20th for a serum testosterone level, HCT and hgb.

## 2018-07-04 NOTE — Telephone Encounter (Signed)
Lm for patient to cb to schedule a lab app for the week on 07-10-18   [facility]

## 2018-07-04 NOTE — Telephone Encounter (Signed)
Called patient and had to lm for him to cb   James Hebert

## 2018-07-31 ENCOUNTER — Encounter: Payer: Self-pay | Admitting: Urology

## 2018-08-01 NOTE — Progress Notes (Signed)
Certified letter sent 08/01/2018 

## 2018-09-06 ENCOUNTER — Other Ambulatory Visit: Payer: Self-pay

## 2018-09-06 DIAGNOSIS — E349 Endocrine disorder, unspecified: Secondary | ICD-10-CM

## 2018-09-06 DIAGNOSIS — E291 Testicular hypofunction: Secondary | ICD-10-CM

## 2018-09-08 ENCOUNTER — Other Ambulatory Visit: Payer: Self-pay

## 2018-09-08 ENCOUNTER — Other Ambulatory Visit: Payer: BLUE CROSS/BLUE SHIELD

## 2018-09-08 ENCOUNTER — Other Ambulatory Visit: Payer: Self-pay | Admitting: Urology

## 2018-09-08 DIAGNOSIS — E291 Testicular hypofunction: Secondary | ICD-10-CM

## 2018-09-08 DIAGNOSIS — E349 Endocrine disorder, unspecified: Secondary | ICD-10-CM

## 2018-09-08 NOTE — Telephone Encounter (Signed)
Pt would like for you to [facility].  He said it usually takes a couple of weeks.

## 2018-09-09 LAB — PSA: Prostate Specific Ag, Serum: 0.5 ng/mL (ref 0.0–4.0)

## 2018-09-09 LAB — HEMOGLOBIN AND HEMATOCRIT, BLOOD
HEMATOCRIT: 50.2 % (ref 37.5–51.0)
HEMOGLOBIN: 16.8 g/dL (ref 13.0–17.7)

## 2018-09-09 LAB — TESTOSTERONE: Testosterone: 460 ng/dL (ref 264–916)

## 2018-09-11 MED ORDER — TESTOSTERONE CYPIONATE 200 MG/ML IM SOLN: INTRAMUSCULAR | 0 refills | Status: DC

## 2018-09-14 ENCOUNTER — Telehealth: Payer: Self-pay | Admitting: Urology

## 2018-09-14 DIAGNOSIS — E291 Testicular hypofunction: Secondary | ICD-10-CM

## 2018-09-14 MED ORDER — TESTOSTERONE CYPIONATE 200 MG/ML IM SOLN: INTRAMUSCULAR | 0 refills | Status: DC

## 2018-09-14 NOTE — Addendum Note (Signed)
Addended by: Anne Fu on: 09/14/2018 01:12 PM   Modules accepted: Orders

## 2018-09-14 NOTE — Telephone Encounter (Signed)
Pt states that his Testosterone was sent to [facility] and it should have been sent to [facility].

## 2018-09-14 NOTE — Telephone Encounter (Signed)
[facility] per pt request

## 2018-09-19 ENCOUNTER — Telehealth: Payer: Self-pay

## 2018-09-19 NOTE — Telephone Encounter (Signed)
-----   Message from Harle Battiest, PA-C sent at 09/19/2018  1:46 PM EDT ----- Please let Mr. Coit know that his labs are good.  We will see him in June.

## 2018-09-19 NOTE — Telephone Encounter (Signed)
Called pt no answer LM for pt informing him of the information below per DPR.

## 2018-12-14 NOTE — Progress Notes (Signed)
06/09/2018 10:06 AM   James Hebert [birth year] [id number]  Referring provider: No referring provider defined for this encounter.  Chief Complaint  Patient presents with  . Hypogonadism    HPI: Patient is a 43 year old Caucasian male with testosterone deficiency, erectile dysfunction and BPH with LUTS who presents today for follow up.     Testosterone deficiency He is still having spontaneous erections at night.  He had sleep apnea and was sleeping with a CPAP machine.  His last testosterone was 460 ng/dL in 62/95/284103/20/2020.  Hbg was 16.8 and HCT was 50.2% on 09/08/2018.  He was managing his testosterone deficiency with testosterone cypionate 200 mg IM every week.  He had been self administering his testosterone.        Erectile dysfunction His SHIM score is 19, which is mild ED.   His previous SHIM score was 17.  He has been having difficulty with erections for several years.   His major complaint is achieving an erection.  His libido is preserved.   His risk factors for ED are age, BPH, testosterone deficiency, HTN and HLD.  He denies any painful erections or curvatures with his erections.    SHIM    Row Name 12/15/18 0940         SHIM: Over the last 6 months:   How do you rate your confidence that you could get and keep an erection?  Moderate     When you had erections with sexual stimulation, how often were your erections hard enough for penetration (entering your partner)?  Most Times (much more than half the time)     During sexual intercourse, how often were you able to maintain your erection after you had penetrated (entered) your partner?  Most Times (much more than half the time)     During sexual intercourse, how difficult was it to maintain your erection to completion of intercourse?  Slightly Difficult     When you attempted sexual intercourse, how often was it satisfactory for you?  Most Times (much more than half the time)       SHIM Total Score   SHIM  19         Score: 1-7 Severe ED 8-11 Moderate ED 12-16 Mild-Moderate ED 17-21 Mild ED 22-25 No ED  BPH WITH LUTS  (prostate and/or bladder) IPSS score: 2/1             Previous score: 3/2        Major complaint(s):  No complaints at this time.  Denies any dysuria, hematuria or suprapubic pain.   Denies any recent fevers, chills, nausea or vomiting.  He does not have a family history of PCa.  IPSS    Row Name 12/15/18 0900         International Prostate Symptom Score   How often have you had the sensation of not emptying your bladder?  Not at All     How often have you had to urinate less than every two hours?  Less than 1 in 5 times     How often have you found you stopped and started again several times when you urinated?  Not at All     How often have you found it difficult to postpone urination?  Not at All     How often have you had a weak urinary stream?  Not at All     How often have you had to strain to start urination?  Not at All     How many times did you typically get up at night to urinate?  1 Time     Total IPSS Score  2       Quality of Life due to urinary symptoms   If you were to spend the rest of your life with your urinary condition just the way it is now how would you feel about that?  Pleased        Score:  1-7 Mild 8-19 Moderate 20-35 Severe   PMH: Past Medical History:  Diagnosis Date  . Benign enlargement of prostate   . Elevated blood pressure   . Erectile dysfunction   . Hypogonadism in male   . Over weight     Surgical History: Past Surgical History:  Procedure Laterality Date  . CHOLECYSTECTOMY    . KNEE ARTHROSCOPY Right    1990  . thrombosis of leg      Home Medications:  Allergies as of 12/15/2018   No Known Allergies     Medication List       Accurate as of December 15, 2018 10:06 AM. If you have any questions, ask your nurse or doctor.        STOP taking these medications   oxyCODONE-acetaminophen 5-325 MG tablet Commonly  known as: Percocet Stopped by: Michiel CowboySHANNON Sanye Ledesma, PA-C     TAKE these medications   sildenafil 20 MG tablet Commonly known as: REVATIO Take 3 to 5 tablets two hours before intercouse on an empty stomach.  Do not take with nitrates.   testosterone cypionate 200 MG/ML injection Commonly known as: DEPOTESTOSTERONE CYPIONATE Inject 1mL every 7 days   Toprol XL 100 MG 24 hr tablet Generic drug: metoprolol succinate       Allergies: No Known Allergies  Family History: Family History  Problem Relation Age of Onset  . Prostate cancer Neg Hx   . Bladder Cancer Neg Hx   . Kidney cancer Neg Hx     Social History:  reports that he has quit smoking. He has never used smokeless tobacco. He reports current alcohol use. He reports that he does not use drugs.  ROS: UROLOGY Frequent Urination?: No Hard to postpone urination?: No Burning/pain with urination?: No Get up at night to urinate?: No Leakage of urine?: No Urine stream starts and stops?: No Trouble starting stream?: No Do you have to strain to urinate?: No Blood in urine?: No Urinary tract infection?: No Sexually transmitted disease?: No Injury to kidneys or bladder?: No Painful intercourse?: No Weak stream?: No Erection problems?: No Penile pain?: No  Gastrointestinal Nausea?: No Vomiting?: No Indigestion/heartburn?: No Diarrhea?: No Constipation?: No  Constitutional Fever: No Night sweats?: No Weight loss?: No Fatigue?: No  Skin Skin rash/lesions?: No Itching?: No  Eyes Blurred vision?: No Double vision?: No  Ears/Nose/Throat Sore throat?: No Sinus problems?: No  Hematologic/Lymphatic Swollen glands?: No Easy bruising?: No  Cardiovascular Leg swelling?: No Chest pain?: No  Respiratory Cough?: No Shortness of breath?: No  Endocrine Excessive thirst?: No  Musculoskeletal Back pain?: No Joint pain?: No  Neurological Headaches?: No Dizziness?: No  Psychologic Depression?: No  Anxiety?: No  Physical Exam: BP (!) 148/78 (BP Location: Left Arm, Patient Position: Sitting, Cuff Size: Normal)   Pulse 74   Ht 5\' 10"  (1.778 m)   Wt 267 lb 8 oz (121.3 kg)   BMI 38.38 kg/m   Constitutional:  Well nourished. Alert and oriented, No acute distress. HEENT: [place] AT, moist mucus  membranes.  Trachea midline, no masses. Cardiovascular: No clubbing, cyanosis, or edema. Respiratory: Normal respiratory effort, no increased work of breathing. GI: Abdomen is soft, non tender, non distended, no abdominal masses. Liver and spleen not palpable.  No hernias appreciated.  Stool sample for occult testing is not indicated.   GU: No CVA tenderness.  No bladder fullness or masses.  Patient with circumcised phallus.  Urethral meatus is patent.  No penile discharge. No penile lesions or rashes. Scrotum without lesions, cysts, rashes and/or edema.  Testicles are located scrotally bilaterally. No masses are appreciated in the testicles. Left and right epididymis are normal. Rectal: Patient with  normal sphincter tone. Anus and perineum without scarring or rashes. No rectal masses are appreciated. Prostate is approximately 45 grams, no nodules are appreciated. Seminal vesicles are normal. Skin: No rashes, bruises or suspicious lesions. Lymph: No inguinal adenopathy. Neurologic: Grossly intact, no focal deficits, moving all 4 extremities. Psychiatric: Normal mood and affect.   Laboratory Data: PSA History  0.4 ng/mL on 03/23/2017  0.5 in 10/2017  0.4 in 05/2018  0.5 in 08/2018  Lab Results  Component Value Date   TESTOSTERONE 460 09/08/2018   I have reviewed the labs.  Assessment & Plan:    1.  Testosterone deficiency:    Testosterone level drawn today - pending  Continue testosterone cypionate 200 mg IM, 1.0 cc weekly RTC in 3 months for HCT, Hgb and testosterone level   2. BPH with LU TS I PSS 2/1, it slightly improved Continue conservative management, avoiding bladder irritants  and timed voiding's  PSA taken today - pending  RTC in 6 months for IPSS, PSA and exam, as testosterone therapy can cause prostate enlargement and worsen LU TS - if PSA is stable  3. Erectile dysfunction:    SHIM score is 19, it is improved Patient is still separated at this time RTC in 6 months for SHIM score and exam, as testosterone therapy can affect erections  Return in about 6 months (around 06/16/2019) for IPSS, SHIM and exam, PSA, testosterone (one week after injection) H & H.  These notes generated with voice recognition software. I apologize for typographical errors.  Zara Council, PA-C  [facility] [address] [place], [place] [address] [phone]

## 2018-12-15 ENCOUNTER — Encounter: Payer: Self-pay | Admitting: Urology

## 2018-12-15 ENCOUNTER — Other Ambulatory Visit: Payer: Self-pay

## 2018-12-15 ENCOUNTER — Ambulatory Visit (INDEPENDENT_AMBULATORY_CARE_PROVIDER_SITE_OTHER): Payer: BC Managed Care – PPO | Admitting: Urology

## 2018-12-15 VITALS — BP 148/78 | HR 74 | Ht 70.0 in | Wt 267.5 lb

## 2018-12-15 DIAGNOSIS — N401 Enlarged prostate with lower urinary tract symptoms: Secondary | ICD-10-CM

## 2018-12-15 DIAGNOSIS — N529 Male erectile dysfunction, unspecified: Secondary | ICD-10-CM | POA: Diagnosis not present

## 2018-12-15 DIAGNOSIS — E349 Endocrine disorder, unspecified: Secondary | ICD-10-CM

## 2018-12-15 DIAGNOSIS — E291 Testicular hypofunction: Secondary | ICD-10-CM | POA: Diagnosis not present

## 2018-12-15 DIAGNOSIS — N138 Other obstructive and reflux uropathy: Secondary | ICD-10-CM

## 2018-12-15 MED ORDER — TESTOSTERONE CYPIONATE 200 MG/ML IM SOLN: INTRAMUSCULAR | 0 refills | Status: DC

## 2018-12-16 LAB — TESTOSTERONE: Testosterone: 753 ng/dL (ref 264–916)

## 2018-12-16 LAB — HEMOGLOBIN: Hemoglobin: 17.9 g/dL — ABNORMAL HIGH (ref 13.0–17.7)

## 2018-12-16 LAB — PSA: Prostate Specific Ag, Serum: 0.4 ng/mL (ref 0.0–4.0)

## 2018-12-16 LAB — HEMATOCRIT: Hematocrit: 50.2 % (ref 37.5–51.0)

## 2018-12-25 ENCOUNTER — Telehealth: Payer: Self-pay | Admitting: Urology

## 2018-12-25 NOTE — Telephone Encounter (Signed)
Pt needs a prior authorization for testosterone.

## 2018-12-25 NOTE — Telephone Encounter (Signed)
PA is in progress. 

## 2018-12-28 ENCOUNTER — Telehealth: Payer: Self-pay | Admitting: Urology

## 2018-12-28 NOTE — Telephone Encounter (Signed)
Pt called office and said [facility] told him there is an issue with PA for RX and left a number to call [phone].

## 2018-12-29 ENCOUNTER — Telehealth: Payer: Self-pay

## 2018-12-29 NOTE — Telephone Encounter (Signed)
Prior Auth approved via cover my meds. CASE # [id number]

## 2018-12-29 NOTE — Telephone Encounter (Signed)
Called [employer], they state that pt did have a PA on file for testosterone but that PA has expired and a new one is needed. They will fax over forms for new PA. Pt informed that new PA has began.

## 2019-01-09 ENCOUNTER — Telehealth: Payer: Self-pay | Admitting: Urology

## 2019-01-09 ENCOUNTER — Other Ambulatory Visit: Payer: Self-pay | Admitting: Urology

## 2019-01-09 NOTE — Telephone Encounter (Signed)
Patient called asking about his medication refill said he was still waiting on it to be approved. Did not say which one and did not leave a call back number?   Sharyn Lull

## 2019-01-09 NOTE — Telephone Encounter (Signed)
Spoke to patient and [facility] sent the wrong testosterone with approval. They have denied the Testosterone cypionate. Patient is going to check pharmacy who cary the 39ml vials of testosterone and call back. He wants to try to get it from a different pharmacy.

## 2019-01-09 NOTE — Telephone Encounter (Signed)
Left patient a detailed message stating prior auth was completed on testosterone , if he is referring to another medication he should call back

## 2019-01-09 NOTE — Telephone Encounter (Signed)
Pt called stating that [facility] is still telling him that his PA for his testosterone is still denied even tho there is a PA authorization documented in pt chart. Pt asks if someone would please call [facility] at [phone] to clear up confusion. Please advise. Thanks.

## 2019-01-19 NOTE — Telephone Encounter (Signed)
Pt called back checking on the status of his refill for Testosterone. Please advise.

## 2019-01-23 NOTE — Telephone Encounter (Signed)
LMOM for patient to return call.

## 2019-02-08 ENCOUNTER — Other Ambulatory Visit: Payer: Self-pay | Admitting: Family Medicine

## 2019-02-08 DIAGNOSIS — E291 Testicular hypofunction: Secondary | ICD-10-CM

## 2019-02-08 NOTE — Telephone Encounter (Signed)
Patient has been unable to get medication from [facility]. He wants to try [facility]. Please advise.

## 2019-02-09 MED ORDER — TESTOSTERONE CYPIONATE 200 MG/ML IM SOLN: INTRAMUSCULAR | 0 refills | Status: DC

## 2019-06-06 ENCOUNTER — Other Ambulatory Visit: Payer: Self-pay

## 2019-06-06 DIAGNOSIS — E349 Endocrine disorder, unspecified: Secondary | ICD-10-CM

## 2019-06-07 ENCOUNTER — Other Ambulatory Visit: Payer: BC Managed Care – PPO

## 2019-06-08 ENCOUNTER — Other Ambulatory Visit: Payer: BC Managed Care – PPO

## 2019-06-08 ENCOUNTER — Other Ambulatory Visit: Payer: Self-pay

## 2019-06-08 DIAGNOSIS — E349 Endocrine disorder, unspecified: Secondary | ICD-10-CM

## 2019-06-09 LAB — TESTOSTERONE: Testosterone: 201 ng/dL — ABNORMAL LOW (ref 264–916)

## 2019-06-09 LAB — HEMOGLOBIN AND HEMATOCRIT, BLOOD
Hematocrit: 47.3 % (ref 37.5–51.0)
Hemoglobin: 16.4 g/dL (ref 13.0–17.7)

## 2019-06-09 LAB — PSA: Prostate Specific Ag, Serum: 0.4 ng/mL (ref 0.0–4.0)

## 2019-06-12 NOTE — Progress Notes (Signed)
06/09/2018 8:53 AM   James Hebert 1975/10/10 [id number]  Referring provider: No referring provider defined for this encounter.  Chief Complaint  Patient presents with  . Hypogonadism    HPI: Patient is a 43 year old male with testosterone deficiency, erectile dysfunction and BPH with LUTS who presents today for follow up.     Testosterone deficiency He is still having an occasional spontaneous erections at night.  He had sleep apnea and was sleeping with a CPAP machine.  He is managing his testosterone deficiency with testosterone cypionate 200 mg IM every week.  He had been self administering his testosterone.        Component     Latest Ref Rng & Units 06/08/2019  Testosterone     264 - 916 ng/dL 201 (L)    Component     Latest Ref Rng & Units 06/08/2019  Hemoglobin     13.0 - 17.7 g/dL 16.4  HCT     37.5 - 51.0 % 47.3   Erectile dysfunction His SHIM score is 16, which is mild to moderate ED.   His previous SHIM score was 19.  He has been having difficulty with erections for several years.   His major complaint is achieving an erection.  His libido is preserved.   His risk factors for ED are age, BPH, testosterone deficiency, HTN and HLD.  He denies any painful erections or curvatures with his erections.    SHIM    Row Name 06/13/19 [phone]         SHIM: Over the last 6 months:   How do you rate your confidence that you could get and keep an erection?  Moderate     When you had erections with sexual stimulation, how often were your erections hard enough for penetration (entering your partner)?  Most Times (much more than half the time)     During sexual intercourse, how often were you able to maintain your erection after you had penetrated (entered) your partner?  Sometimes (about half the time)     During sexual intercourse, how difficult was it to maintain your erection to completion of intercourse?  Difficult     When you attempted sexual intercourse, how often  was it satisfactory for you?  Sometimes (about half the time)       SHIM Total Score   SHIM  16        Score: 1-7 Severe ED 8-11 Moderate ED 12-16 Mild-Moderate ED 17-21 Mild ED 22-25 No ED  BPH WITH LUTS  (prostate and/or bladder) IPSS score: 1/1         Previous score: 2/1        Major complaint(s):  No complaints at this time.  Denies any dysuria, hematuria or suprapubic pain.   Denies any recent fevers, chills, nausea or vomiting.  He does not have a family history of PCa.  Component     Latest Ref Rng & Units 06/08/2019  Prostate Specific Ag, Serum     0.0 - 4.0 ng/mL 0.4   IPSS    Row Name 06/13/19 0800         International Prostate Symptom Score   How often have you had the sensation of not emptying your bladder?  Not at All     How often have you had to urinate less than every two hours?  Not at All     How often have you found you stopped and started again several  times when you urinated?  Not at All     How often have you found it difficult to postpone urination?  Not at All     How often have you had a weak urinary stream?  Not at All     How often have you had to strain to start urination?  Not at All     How many times did you typically get up at night to urinate?  1 Time     Total IPSS Score  1       Quality of Life due to urinary symptoms   If you were to spend the rest of your life with your urinary condition just the way it is now how would you feel about that?  Pleased        Score:  1-7 Mild 8-19 Moderate 20-35 Severe   PMH: Past Medical History:  Diagnosis Date  . Benign enlargement of prostate   . Elevated blood pressure   . Erectile dysfunction   . Hypogonadism in male   . Over weight     Surgical History: Past Surgical History:  Procedure Laterality Date  . CHOLECYSTECTOMY    . KNEE ARTHROSCOPY Right    1990  . thrombosis of leg      Home Medications:  Allergies as of 06/13/2019   No Known Allergies     Medication  List       Accurate as of June 13, 2019  8:53 AM. If you have any questions, ask your nurse or doctor.        hydrochlorothiazide 12.5 MG capsule Commonly known as: MICROZIDE hydrochlorothiazide 12.5 mg capsule   HYDROcodone-acetaminophen 7.5-325 MG tablet Commonly known as: NORCO hydrocodone 7.5 mg-acetaminophen 325 mg tablet   lisinopril-hydrochlorothiazide 10-12.5 MG tablet Commonly known as: ZESTORETIC lisinopril 10 mg-hydrochlorothiazide 12.5 mg tablet   sildenafil 20 MG tablet Commonly known as: REVATIO Take 3 to 5 tablets two hours before intercouse on an empty stomach.  Do not take with nitrates.   testosterone cypionate 200 MG/ML injection Commonly known as: DEPOTESTOSTERONE CYPIONATE Inject 1mL every 7 days   Toprol XL 100 MG 24 hr tablet Generic drug: metoprolol succinate   traZODone 50 MG tablet Commonly known as: DESYREL trazodone 50 mg tablet       Allergies: No Known Allergies  Family History: Family History  Problem Relation Age of Onset  . Prostate cancer Neg Hx   . Bladder Cancer Neg Hx   . Kidney cancer Neg Hx     Social History:  reports that he has quit smoking. He has never used smokeless tobacco. He reports current alcohol use. He reports that he does not use drugs.  ROS: UROLOGY Frequent Urination?: No Hard to postpone urination?: No Burning/pain with urination?: No Get up at night to urinate?: No Leakage of urine?: No Urine stream starts and stops?: No Trouble starting stream?: No Do you have to strain to urinate?: No Blood in urine?: No Urinary tract infection?: No Sexually transmitted disease?: No Injury to kidneys or bladder?: No Painful intercourse?: No Weak stream?: No Erection problems?: No Penile pain?: No  Gastrointestinal Nausea?: No Vomiting?: No Indigestion/heartburn?: No Diarrhea?: No Constipation?: No  Constitutional Fever: No Night sweats?: No Weight loss?: No Fatigue?: No  Skin Skin  rash/lesions?: No Itching?: No  Eyes Blurred vision?: No Double vision?: No  Ears/Nose/Throat Sore throat?: No Sinus problems?: No  Hematologic/Lymphatic Swollen glands?: No Easy bruising?: No  Cardiovascular Leg swelling?: No Chest pain?: No  Respiratory Cough?: No Shortness of breath?: No  Endocrine Excessive thirst?: No  Musculoskeletal Back pain?: No Joint pain?: No  Neurological Headaches?: No Dizziness?: No  Psychologic Depression?: No Anxiety?: No  Physical Exam: BP (!) 151/85   Pulse 65   Ht 5\' 10"  (1.778 m)   Wt 278 lb 12.8 oz (126.5 kg)   BMI 40.00 kg/m   Constitutional:  Well nourished. Alert and oriented, No acute distress. HEENT: [place] AT, moist mucus membranes.  Trachea midline, no masses. Cardiovascular: No clubbing, cyanosis, or edema. Respiratory: Normal respiratory effort, no increased work of breathing. GI: Abdomen is soft, non tender, non distended, no abdominal masses. Liver and spleen not palpable.  No hernias appreciated.  Stool sample for occult testing is not indicated.   GU: No CVA tenderness.  No bladder fullness or masses.  Patient with circumcised phallus.  Urethral meatus is patent.  No penile discharge. No penile lesions or rashes. Scrotum without lesions, cysts, rashes and/or edema.  Testicles are located scrotally bilaterally. No masses are appreciated in the testicles. Left and right epididymis are normal. Rectal: Patient with  normal sphincter tone. Anus and perineum without scarring or rashes. No rectal masses are appreciated. Prostate is approximately 50 grams, no nodules are appreciated. Seminal vesicles could not be palpated Skin: No rashes, bruises or suspicious lesions. Lymph: No inguinal adenopathy. Neurologic: Grossly intact, no focal deficits, moving all 4 extremities. Psychiatric: Normal mood and affect.   Laboratory Data: Component     Latest Ref Rng & Units 12/31/2014 09/05/2015 03/09/2016 04/19/2017  Prostate  Specific Ag, Serum     0.0 - 4.0 ng/mL 0.5 0.3 0.6 0.4   Component     Latest Ref Rng & Units 11/03/2017 06/09/2018 09/08/2018 12/15/2018  Prostate Specific Ag, Serum     0.0 - 4.0 ng/mL 0.5 0.4 0.5 0.4   Component     Latest Ref Rng & Units 06/08/2019  Prostate Specific Ag, Serum     0.0 - 4.0 ng/mL 0.4    Lab Results  Component Value Date   TESTOSTERONE 201 (L) 06/08/2019   I have reviewed the labs.  Assessment & Plan:    1.  Testosterone deficiency:    Not at therapeutic levels - he has not had an injection for several weeks due to insurance difficulties  Continue testosterone cypionate 200 mg IM, 1.0 cc weekly RTC in 3 months for HCT, Hgb and testosterone level   2. BPH with LU TS I PSS 1/1, it slightly improved Continue conservative management, avoiding bladder irritants and timed voiding's  PSA stable RTC in 6 months for IPSS, PSA and exam, as testosterone therapy can cause prostate enlargement and worsen LU TS   3. Erectile dysfunction:    SHIM score is 16, it is slightly worse  Patient is still separated at this time RTC in 6 months for SHIM score and exam, as testosterone therapy can affect erections  Return in about 3 months (around 09/11/2019) for Testosterone (one week after injection) H & H only .  These notes generated with voice recognition software. I apologize for typographical errors.  09/13/2019, PA-C  [facility] [address] [place], [place] [phone]

## 2019-06-13 ENCOUNTER — Other Ambulatory Visit: Payer: Self-pay

## 2019-06-13 ENCOUNTER — Ambulatory Visit (INDEPENDENT_AMBULATORY_CARE_PROVIDER_SITE_OTHER): Payer: BC Managed Care – PPO | Admitting: Urology

## 2019-06-13 ENCOUNTER — Encounter: Payer: Self-pay | Admitting: Urology

## 2019-06-13 VITALS — BP 151/85 | HR 65 | Ht 70.0 in | Wt 278.8 lb

## 2019-06-13 DIAGNOSIS — E291 Testicular hypofunction: Secondary | ICD-10-CM | POA: Diagnosis not present

## 2019-06-13 DIAGNOSIS — N138 Other obstructive and reflux uropathy: Secondary | ICD-10-CM

## 2019-06-13 DIAGNOSIS — N401 Enlarged prostate with lower urinary tract symptoms: Secondary | ICD-10-CM

## 2019-06-13 DIAGNOSIS — E349 Endocrine disorder, unspecified: Secondary | ICD-10-CM

## 2019-06-13 DIAGNOSIS — N529 Male erectile dysfunction, unspecified: Secondary | ICD-10-CM | POA: Diagnosis not present

## 2019-06-13 MED ORDER — TESTOSTERONE CYPIONATE 200 MG/ML IM SOLN: INTRAMUSCULAR | 0 refills | Status: DC

## 2019-06-24 ENCOUNTER — Encounter: Payer: Self-pay | Admitting: Emergency Medicine

## 2019-06-24 ENCOUNTER — Emergency Department: Payer: BC Managed Care – PPO

## 2019-06-24 ENCOUNTER — Emergency Department
Admission: EM | Admit: 2019-06-24 | Discharge: 2019-06-24 | Disposition: A | Discharge status: Home / Self Care | Payer: BC Managed Care – PPO | Attending: Student in an Organized Health Care Education/Training Program | Admitting: Student in an Organized Health Care Education/Training Program

## 2019-06-24 ENCOUNTER — Other Ambulatory Visit: Payer: Self-pay

## 2019-06-24 DIAGNOSIS — Z79899 Other long term (current) drug therapy: Secondary | ICD-10-CM | POA: Diagnosis not present

## 2019-06-24 DIAGNOSIS — Z87891 Personal history of nicotine dependence: Secondary | ICD-10-CM | POA: Insufficient documentation

## 2019-06-24 DIAGNOSIS — M79604 Pain in right leg: Secondary | ICD-10-CM | POA: Diagnosis not present

## 2019-06-24 LAB — COMPREHENSIVE METABOLIC PANEL
ALT: 59 U/L — ABNORMAL HIGH (ref 0–44)
AST: 38 U/L (ref 15–41)
Albumin: 4.4 g/dL (ref 3.5–5.0)
Alkaline Phosphatase: 38 U/L (ref 38–126)
Anion gap: 12 (ref 5–15)
BUN: 19 mg/dL (ref 6–20)
CO2: 26 mmol/L (ref 22–32)
Calcium: 9.4 mg/dL (ref 8.9–10.3)
Chloride: 101 mmol/L (ref 98–111)
Creatinine, Ser: 1.55 mg/dL — ABNORMAL HIGH (ref 0.61–1.24)
GFR calc Af Amer: >60 mL/min (ref >60)
GFR calc non Af Amer: 53 mL/min — ABNORMAL LOW (ref >60)
Glucose, Bld: 94 mg/dL (ref 70–99)
Potassium: 4.4 mmol/L (ref 3.5–5.1)
Sodium: 139 mmol/L (ref 135–145)
Total Bilirubin: 0.9 mg/dL (ref 0.3–1.2)
Total Protein: 7.3 g/dL (ref 6.5–8.1)

## 2019-06-24 LAB — CBC WITH DIFFERENTIAL/PLATELET
Abs Immature Granulocytes: 0.02 10*3/uL (ref 0.00–0.07)
Basophils Absolute: 0.1 10*3/uL (ref 0.0–0.1)
Basophils Relative: 1 %
Eosinophils Absolute: 0.2 10*3/uL (ref 0.0–0.5)
Eosinophils Relative: 3 %
HCT: 47.1 % (ref 39.0–52.0)
Hemoglobin: 16.9 g/dL (ref 13.0–17.0)
Immature Granulocytes: 0 %
Lymphocytes Relative: 31 %
Lymphs Abs: 2.2 10*3/uL (ref 0.7–4.0)
MCH: 30.8 pg (ref 26.0–34.0)
MCHC: 35.9 g/dL (ref 30.0–36.0)
MCV: 85.9 fL (ref 80.0–100.0)
Monocytes Absolute: 0.6 10*3/uL (ref 0.1–1.0)
Monocytes Relative: 8 %
Neutro Abs: 4 10*3/uL (ref 1.7–7.7)
Neutrophils Relative %: 57 %
Platelets: 161 10*3/uL (ref 150–400)
RBC: 5.48 MIL/uL (ref 4.22–5.81)
RDW: 13.2 % (ref 11.5–15.5)
WBC: 7 10*3/uL (ref 4.0–10.5)
nRBC: 0 % (ref 0.0–0.2)

## 2019-06-24 LAB — LACTIC ACID, PLASMA: Lactic Acid, Venous: 1.5 mmol/L (ref 0.5–1.9)

## 2019-06-24 MED ORDER — ONDANSETRON HCL 4 MG/2ML IJ SOLN
4.0000 mg | Freq: Once | INTRAMUSCULAR | Status: AC
  Administered 2019-06-24: 4 mg via INTRAVENOUS
  Filled 2019-06-24: qty 2

## 2019-06-24 MED ORDER — MELOXICAM 15 MG PO TABS: 15.0000 mg | ORAL_TABLET | Freq: Every day | ORAL | 0 refills | Status: AC

## 2019-06-24 MED ORDER — SODIUM CHLORIDE 0.9 % IV BOLUS
1000.0000 mL | Freq: Once | INTRAVENOUS | Status: AC
  Administered 2019-06-24: 1000 mL via INTRAVENOUS

## 2019-06-24 MED ORDER — HYDROCODONE-ACETAMINOPHEN 5-325 MG PO TABS: 1.0000 | ORAL_TABLET | ORAL | 0 refills | Status: AC | PRN

## 2019-06-24 MED ORDER — MORPHINE SULFATE (PF) 4 MG/ML IV SOLN
4.0000 mg | Freq: Once | INTRAVENOUS | Status: AC
  Administered 2019-06-24: 15:00:00 4 mg via INTRAVENOUS
  Filled 2019-06-24: qty 1

## 2019-06-24 MED ORDER — IOHEXOL 300 MG/ML  SOLN
100.0000 mL | Freq: Once | INTRAMUSCULAR | Status: AC | PRN
  Administered 2019-06-24: 100 mL via INTRAVENOUS
  Filled 2019-06-24: qty 100

## 2019-06-24 NOTE — ED Provider Notes (Signed)
[facility] Provider Note  ____________________________________________  Time seen: Approximately 1:21 PM  I have reviewed the triage vital signs and the nursing notes.   HISTORY  Chief Complaint Leg Pain    HPI James Hebert is a 44 y.o. male who presents the emergency department for evaluation of right leg pain.  Patient has an area along the anterolateral aspect of the hip that is very painful, warm to the touch.  Patient states that symptoms have been present x4 days and have been constantly worsening.  Patient states that this area is hot to the touch.  No overlying skin changes.  Given the patient's pain, warmth he was concerned that he may have a DVT as he has had similar symptoms to the left leg and was diagnosed with a DVT.  No other symptoms at this time.  No medications prior to arrival.         Past Medical History:  Diagnosis Date  . Benign enlargement of prostate   . DVT (deep venous thrombosis) (HCC) 2013   left leg  . Elevated blood pressure   . Erectile dysfunction   . Hypogonadism in male   . Over weight     Patient Active Problem List   Diagnosis Date Noted  . Low back pain 02/06/2018  . Peroneal tendinitis 11/03/2016  . BPH with obstruction/lower urinary tract symptoms 09/10/2015  . Erectile dysfunction of organic origin 09/10/2015  . Hypogonadism in male 09/10/2015  . Hypotestosteronism 11/15/2014    Past Surgical History:  Procedure Laterality Date  . CHOLECYSTECTOMY    . KNEE ARTHROSCOPY Right    1990  . thrombosis of leg      Prior to Admission medications   Medication Sig Start Date End Date Taking? Authorizing Provider  hydrochlorothiazide (MICROZIDE) 12.5 MG capsule hydrochlorothiazide 12.5 mg capsule    [provider]  HYDROcodone-acetaminophen (NORCO/VICODIN) 5-325 MG tablet Take 1 tablet by mouth every 4 (four) hours as needed for moderate pain. 06/24/19   Yamaris Cummings, Delorise Royals,  PA-C  lisinopril-hydrochlorothiazide (ZESTORETIC) 10-12.5 MG tablet lisinopril 10 mg-hydrochlorothiazide 12.5 mg tablet    [provider]  meloxicam (MOBIC) 15 MG tablet Take 1 tablet (15 mg total) by mouth daily. 06/24/19   Phat Dalton, Delorise Royals, PA-C  sildenafil (REVATIO) 20 MG tablet Take 3 to 5 tablets two hours before intercouse on an empty stomach.  Do not take with nitrates. 05/05/17   Michiel Cowboy A, PA-C  testosterone cypionate (DEPOTESTOSTERONE CYPIONATE) 200 MG/ML injection Inject 36mL every 7 days 06/13/19   Michiel Cowboy A, PA-C  TOPROL XL 100 MG 24 hr tablet  11/29/17   [provider]  traZODone (DESYREL) 50 MG tablet trazodone 50 mg tablet    [provider]    Allergies Patient has no known allergies.  Family History  Problem Relation Age of Onset  . Prostate cancer Neg Hx   . Bladder Cancer Neg Hx   . Kidney cancer Neg Hx     Social History Social History   Tobacco Use  . Smoking status: Former Games developer  . Smokeless tobacco: Never Used  . Tobacco comment: quit 18 years ago  Substance Use Topics  . Alcohol use: Yes    Alcohol/week: 0.0 standard drinks    Comment: occasionally  . Drug use: No     Review of Systems  Constitutional: No fever/chills Eyes: No visual changes. No discharge ENT: No upper respiratory complaints. Cardiovascular: no chest pain. Respiratory: no cough.  No SOB. Gastrointestinal: No abdominal pain.  No nausea, no vomiting. Musculoskeletal: Positive for right lower extremity pain, warmth to touch Skin: Negative for rash, abrasions, lacerations, ecchymosis. Neurological: Negative for headaches, focal weakness or numbness. 10-point ROS otherwise negative.  ____________________________________________   PHYSICAL EXAM:  VITAL SIGNS: ED Triage Vitals  Enc Vitals Group     BP 06/24/19 1101 (!) 151/82     Pulse Rate 06/24/19 1101 65     Resp 06/24/19 1101 18     Temp 06/24/19 1101 98.7 F (37.1 C)      Temp Source 06/24/19 1101 Oral     SpO2 06/24/19 1101 95 %     Weight 06/24/19 1102 270 lb (122.5 kg)     Height 06/24/19 1102 5\' 9"  (1.753 m)     Head Circumference --      Peak Flow --      Pain Score --      Pain Loc --      Pain Edu? --      Excl. in [place]? --      Constitutional: Alert and oriented. Well appearing and in no acute distress. Eyes: Conjunctivae are normal. PERRL. EOMI. Head: Atraumatic. ENT:      Ears:       Nose: No congestion/rhinnorhea.      Mouth/Throat: Mucous membranes are moist.  Neck: No stridor.    Cardiovascular: Normal rate, regular rhythm. Normal S1 and S2.  Good peripheral circulation. Respiratory: Normal respiratory effort without tachypnea or retractions. Lungs CTAB. Good air entry to the bases with no decreased or absent breath sounds. Musculoskeletal: Full range of motion to all extremities. No gross deformities appreciated.  Visualization of the right thigh reveals no overlying skin changes.  No ecchymosis, gross erythema or edema.  Palpation over area of pain reveals significant tenderness.  No appreciable fluctuance or induration.  This area is very warm to the touch when compared with the rest of the extremity.  This is over the lateral aspect of the quad.  No tenderness to palpation over the hip or knee.  Dorsalis pedis pulse and sensation intact distally. Neurologic:  Normal speech and language. No gross focal neurologic deficits are appreciated.  Skin:  Skin is warm, dry and intact. No rash noted. Psychiatric: Mood and affect are normal. Speech and behavior are normal. Patient exhibits appropriate insight and judgement.   ____________________________________________   LABS (all labs ordered are listed, but only abnormal results are displayed)  Labs Reviewed  COMPREHENSIVE METABOLIC PANEL - Abnormal; Notable for the following components:      Result Value   Creatinine, Ser 1.55 (*)    ALT 59 (*)    GFR calc non Af Amer 53 (*)    All other  components within normal limits  CULTURE, BLOOD (ROUTINE X 2)  CULTURE, BLOOD (ROUTINE X 2)  CBC WITH DIFFERENTIAL/PLATELET  LACTIC ACID, PLASMA  LACTIC ACID, PLASMA   ____________________________________________  EKG   ____________________________________________  RADIOLOGY I personally viewed and evaluated these images as part of my medical decision making, as well as reviewing the written report by the radiologist.  CT FEMUR RIGHT W CONTRAST  Result Date: 06/24/2019 CLINICAL DATA:  Right thigh pain. Clinical concern for infection EXAM: CT OF THE LOWER RIGHT EXTREMITY WITH CONTRAST TECHNIQUE: Multidetector CT imaging of the lower right extremity was performed according to the standard protocol following intravenous contrast administration. COMPARISON:  None. CONTRAST:  172mL OMNIPAQUE IOHEXOL 300 MG/ML  SOLN FINDINGS: Bones/Joint/Cartilage No acute  fracture. No dislocation. No lytic or sclerotic bone lesions. Right hip joint space is maintained without significant arthropathy. Mild enthesopathic greater trochanteric changes. No hip joint effusion. Mild tricompartmental degenerative changes of the right knee with chondrocalcinosis. Trace knee joint effusion. Mild degenerative changes at the pubic symphysis with subchondral sclerosis, subchondral cystic change, and chondrocalcinosis. Visualized right SI joint is intact and unremarkable. Ligaments Suboptimally assessed by CT. Muscles and Tendons Normal muscle bulk. No muscle atrophy, fatty infiltration, or intramuscular fluid collection. Tendinous structures of the included right lower extremity are grossly intact within the limitations of CT. Soft tissues No soft tissue edema or fluid. No soft tissue gas. No deep fascial fluid. No solid or cystic mass identified. No lymphadenopathy. No acute findings within the included portion of the low pelvis. IMPRESSION: 1. No acute soft tissue or osseous abnormality of the right femur/thigh. 2. Mild  tricompartmental degenerative changes of the right knee with chondrocalcinosis. Trace knee joint effusion. Electronically Signed   By: Duanne Guess D.O.   On: 06/24/2019 16:20   US Venous Img Lower Unilateral Right  Result Date: 06/24/2019 CLINICAL DATA:  Right lower pain. History of previous left lower extremity DVT. Evaluate for DVT. EXAM: RIGHT LOWER EXTREMITY VENOUS DOPPLER ULTRASOUND TECHNIQUE: Gray-scale sonography with graded compression, as well as color Doppler and duplex ultrasound were performed to evaluate the lower extremity deep venous systems from the level of the common femoral vein and including the common femoral, femoral, profunda femoral, popliteal and calf veins including the posterior tibial, peroneal and gastrocnemius veins when visible. The superficial great saphenous vein was also interrogated. Spectral Doppler was utilized to evaluate flow at rest and with distal augmentation maneuvers in the common femoral, femoral and popliteal veins. COMPARISON:  Right lower extremity venous Doppler ultrasound-07/09/2017 FINDINGS: Contralateral Common Femoral Vein: Respiratory phasicity is normal and symmetric with the symptomatic side. No evidence of thrombus. Normal compressibility. Common Femoral Vein: No evidence of thrombus. Normal compressibility, respiratory phasicity and response to augmentation. Saphenofemoral Junction: No evidence of thrombus. Normal compressibility and flow on color Doppler imaging. Profunda Femoral Vein: No evidence of thrombus. Normal compressibility and flow on color Doppler imaging. Femoral Vein: No evidence of thrombus. Normal compressibility, respiratory phasicity and response to augmentation. Popliteal Vein: No evidence of thrombus. Normal compressibility, respiratory phasicity and response to augmentation. Calf Veins: No evidence of thrombus. Normal compressibility and flow on color Doppler imaging. Superficial Great Saphenous Vein: No evidence of thrombus.  Normal compressibility. Venous Reflux:  None. Other Findings:  None. IMPRESSION: No evidence of DVT within the right lower extremity. Electronically Signed   By: Simonne Come M.D.   On: 06/24/2019 11:43    ____________________________________________    PROCEDURES  Procedure(s) performed:    Procedures    Medications  sodium chloride 0.9 % bolus 1,000 mL (1,000 mLs Intravenous New Bag/Given 06/24/19 1441)  ondansetron (ZOFRAN) injection 4 mg (4 mg Intravenous Given 06/24/19 1443)  morphine 4 MG/ML injection 4 mg (4 mg Intravenous Given 06/24/19 1443)  iohexol (OMNIPAQUE) 300 MG/ML solution 100 mL (100 mLs Intravenous Contrast Given 06/24/19 1554)     ____________________________________________   INITIAL IMPRESSION / ASSESSMENT AND PLAN / ED COURSE  Pertinent labs & imaging results that were available during my care of the patient were reviewed by me and considered in my medical decision making (see chart for details).  Review of the [place] CSRS was performed in accordance of the [facility] prior to dispensing any controlled drugs.  Patient's diagnosis is consistent with right leg pain.  Patient presented to emergency department with pain to the right lateral quadriceps.  Patient had experienced increasing pain over the past several days.  Area was warm to touch but no overlying skin changes.  Patient had a history of DVT to the left leg in 2013.  Patient was concern for same.  Initial ultrasound reveals no evidence of DVT.  Physical exam was concerning for warmth to the area though no overlying skin changes.  Patient does give himself testosterone shots and differential at that time included localized reaction to medication, intramuscular abscess, muscle strain.  CT scan reveals no evidence of infection or significant muscle tear.  At this time patient was placed on anti-inflammatory, limited pain medication.  If patient should experience worsening symptoms, to include worsening pain,  worsening swelling, erythema of the skin, numbness or tingling of the extremity he should return for further evaluation..  Otherwise, follow-up primary care.  Patient is given ED precautions to return to the ED for any worsening or new symptoms.     ____________________________________________  FINAL CLINICAL IMPRESSION(S) / ED DIAGNOSES  Final diagnoses:  Right leg pain      NEW MEDICATIONS STARTED DURING THIS VISIT:  ED Discharge Orders         Ordered    meloxicam (MOBIC) 15 MG tablet  Daily     06/24/19 1652    HYDROcodone-acetaminophen (NORCO/VICODIN) 5-325 MG tablet  Every 4 hours PRN     06/24/19 1652              This chart was dictated using voice recognition software/Dragon. Despite best efforts to proofread, errors can occur which can change the meaning. Any change was purely unintentional.    Racheal Patches, PA-C 06/24/19 1652    Willy Eddy, MD 06/25/19 1308

## 2019-06-24 NOTE — ED Triage Notes (Signed)
Pt arrived via POV with reports of right thigh pain x 4 days, pt has hx of DVT in left leg in 2013, states the pain feels the same. Not currently on blood thinner.  No recent  Long distance travel.

## 2019-06-24 NOTE — ED Notes (Signed)
Patient transported to CT 

## 2019-06-29 LAB — CULTURE, BLOOD (ROUTINE X 2)
Culture: NO GROWTH
Culture: NO GROWTH
Special Requests: ADEQUATE
Special Requests: ADEQUATE

## 2019-08-16 ENCOUNTER — Other Ambulatory Visit: Payer: Self-pay | Admitting: Urology

## 2019-08-16 DIAGNOSIS — E291 Testicular hypofunction: Secondary | ICD-10-CM

## 2019-08-31 ENCOUNTER — Other Ambulatory Visit: Payer: Self-pay

## 2019-08-31 DIAGNOSIS — E291 Testicular hypofunction: Secondary | ICD-10-CM

## 2019-08-31 NOTE — Telephone Encounter (Signed)
I agree.  I typically prescribe 10 mL vials and the pharmacy decides what to dispense to the patient.  We can go ahead and try to send it again as 10 mL vial and see what happens.  He is also due for blood work this month, testosterone level, HCT and hemoglobin.

## 2019-08-31 NOTE — Telephone Encounter (Signed)
RX for controlled so you will need to send in please

## 2019-08-31 NOTE — Telephone Encounter (Signed)
Pt calls and states that per [facility] and his insurance company it would be cheaper for his RX for testosterone to be sent in as a 5mL vial vs. indivdial advised pt that typically how rx is dispensed is up to the pharmacy, however told pt I would make provider aware. Pt states that he is completely out. [facility] [place], [address].

## 2019-09-03 ENCOUNTER — Other Ambulatory Visit: Payer: Self-pay

## 2019-09-03 DIAGNOSIS — E291 Testicular hypofunction: Secondary | ICD-10-CM

## 2019-09-06 ENCOUNTER — Other Ambulatory Visit: Payer: Self-pay | Admitting: *Deleted

## 2019-09-06 DIAGNOSIS — E291 Testicular hypofunction: Secondary | ICD-10-CM

## 2019-09-13 ENCOUNTER — Other Ambulatory Visit: Payer: BC Managed Care – PPO

## 2019-09-13 ENCOUNTER — Other Ambulatory Visit: Payer: Self-pay | Admitting: Urology

## 2019-09-13 ENCOUNTER — Other Ambulatory Visit: Payer: Self-pay

## 2019-09-13 DIAGNOSIS — E291 Testicular hypofunction: Secondary | ICD-10-CM

## 2019-09-13 NOTE — Telephone Encounter (Signed)
Patient called the office and left a voice mail message on the triage line that he needs James Hebert to make a change to his prescription.  He did not say what the change needs to be.  Please contact the patient to find out more information.  He can be reached at [phone].

## 2019-09-14 ENCOUNTER — Telehealth: Payer: Self-pay

## 2019-09-14 DIAGNOSIS — E349 Endocrine disorder, unspecified: Secondary | ICD-10-CM

## 2019-09-14 LAB — HEMOGLOBIN AND HEMATOCRIT, BLOOD
Hematocrit: 48.4 % (ref 37.5–51.0)
Hemoglobin: 17.1 g/dL (ref 13.0–17.7)

## 2019-09-14 LAB — TESTOSTERONE: Testosterone: 1109 ng/dL — ABNORMAL HIGH (ref 264–916)

## 2019-09-14 NOTE — Telephone Encounter (Signed)
Patient requests 41ml vial, it is cheaper than the current RX he has been receiving. Will [facility] to [facility] as [facility].

## 2019-09-14 NOTE — Telephone Encounter (Signed)
Called pt informed him of the information below. Pt states that he gave himself and injection the day before his blood draw, he also asks again that you send in a 48mL quantity instead of 65mL for cost reasons, it is more cost effective for the pt to get the larger vial.

## 2019-09-14 NOTE — Telephone Encounter (Signed)
-----   Message from Harle Battiest, PA-C sent at 09/14/2019  7:50 AM EDT ----- James Hebert testosterone is elevated.  When did he administer his injection prior to his blood work?

## 2019-09-17 ENCOUNTER — Encounter: Payer: Self-pay | Admitting: Urology

## 2019-09-17 ENCOUNTER — Other Ambulatory Visit: Payer: Self-pay | Admitting: Urology

## 2019-09-17 ENCOUNTER — Other Ambulatory Visit: Payer: Self-pay

## 2019-09-17 DIAGNOSIS — E291 Testicular hypofunction: Secondary | ICD-10-CM

## 2019-09-17 MED ORDER — TESTOSTERONE CYPIONATE 200 MG/ML IM SOLN: INTRAMUSCULAR | 0 refills | Status: DC

## 2019-09-17 NOTE — Telephone Encounter (Signed)
-----   Message from Harle Battiest, PA-C sent at 09/17/2019  7:54 AM EDT ----- I need to have James Hebert to have a repeat of his testosterone level 3 to 4 days after he injects himself next week.  I have sent a prescription in for a 10 mL vial.

## 2019-09-17 NOTE — Telephone Encounter (Signed)
Patient notified and scheduled, lab order placed

## 2019-09-18 NOTE — Telephone Encounter (Signed)
Spoke with patient, he will contact [facility] to get 1 66ml vials. Explained to patient pharmacies cannot give vials based on supplier per [facility] and [facility]. Patient voiced understanding.

## 2019-09-20 ENCOUNTER — Other Ambulatory Visit: Payer: Self-pay | Admitting: Urology

## 2019-09-20 ENCOUNTER — Other Ambulatory Visit: Payer: Self-pay

## 2019-09-20 ENCOUNTER — Other Ambulatory Visit: Payer: BC Managed Care – PPO

## 2019-09-20 DIAGNOSIS — E291 Testicular hypofunction: Secondary | ICD-10-CM

## 2019-09-20 DIAGNOSIS — E349 Endocrine disorder, unspecified: Secondary | ICD-10-CM

## 2019-09-20 MED ORDER — TESTOSTERONE CYPIONATE 200 MG/ML IM SOLN: INTRAMUSCULAR | 0 refills | Status: AC

## 2019-09-20 NOTE — Telephone Encounter (Signed)
Patient returned call, requests testosterone be sent to [facility].

## 2019-09-21 LAB — TESTOSTERONE: Testosterone: 326 ng/dL (ref 264–916)

## 2019-09-25 ENCOUNTER — Telehealth: Payer: Self-pay | Admitting: Family Medicine

## 2019-09-25 NOTE — Telephone Encounter (Signed)
LMOM for patient to return call.

## 2019-09-25 NOTE — Telephone Encounter (Signed)
-----   Message from Harle Battiest, PA-C sent at 09/24/2019  7:50 AM EDT ----- Please let James Hebert know that his testosterone returned within normal levels.  He will need a follow up in 3 months for I PSS, SHIM and exam.  He will need his testosterone level, HCT, Hgb and PSA drawn prior to that appointment.

## 2019-09-27 ENCOUNTER — Other Ambulatory Visit: Payer: Self-pay | Admitting: Family Medicine

## 2019-09-27 DIAGNOSIS — E291 Testicular hypofunction: Secondary | ICD-10-CM

## 2019-09-27 NOTE — Telephone Encounter (Signed)
Patient notified and voiced understanding. 3 month follow up has been scheduled and lab orders are in.

## 2019-12-20 ENCOUNTER — Other Ambulatory Visit: Payer: Self-pay

## 2019-12-25 ENCOUNTER — Ambulatory Visit: Payer: Self-pay | Admitting: Urology

## 2019-12-26 ENCOUNTER — Encounter: Payer: Self-pay | Admitting: Urology

## 2020-02-11 ENCOUNTER — Ambulatory Visit
Admission: RE | Admit: 2020-02-11 | Discharge: 2020-02-11 | Disposition: A | Discharge status: Home / Self Care | Payer: BC Managed Care – PPO | Source: Ambulatory Visit | Attending: Family Medicine | Admitting: Family Medicine

## 2020-02-11 ENCOUNTER — Other Ambulatory Visit: Payer: Self-pay | Admitting: Family Medicine

## 2020-02-11 ENCOUNTER — Other Ambulatory Visit: Payer: Self-pay

## 2020-02-11 DIAGNOSIS — M25531 Pain in right wrist: Secondary | ICD-10-CM

## 2020-02-11 DIAGNOSIS — M545 Low back pain, unspecified: Secondary | ICD-10-CM

## 2020-02-11 DIAGNOSIS — M25512 Pain in left shoulder: Secondary | ICD-10-CM

## 2020-09-22 ENCOUNTER — Telehealth: Payer: Self-pay | Admitting: Urology

## 2020-09-22 NOTE — Telephone Encounter (Signed)
Please call James Hebert and let him know that it has over a year since we have seen him in the office.   He needs to be seen if he is going to continue his testosterone therapy.

## 2020-09-25 NOTE — Telephone Encounter (Signed)
.  left message to have patient return my call.  

## 2020-09-26 NOTE — Telephone Encounter (Signed)
Spoke with patient and he will continue seeing his PCP for testosterone therapy. Per pt it's embarrassing to have to ask off work so much and since he's already seeing his PCP for other issues he will just stick with them, its been 2 years per pt. Pt see's [facility].

## 2021-11-18 IMAGING — CR DG WRIST COMPLETE 3+V*R*
4 series · 4 of 4 positions shown · non-contrast
Comparison: None.

CLINICAL DATA: Pain

EXAM:
RIGHT WRIST - COMPLETE 3+ VIEW

[x wrist pa right]
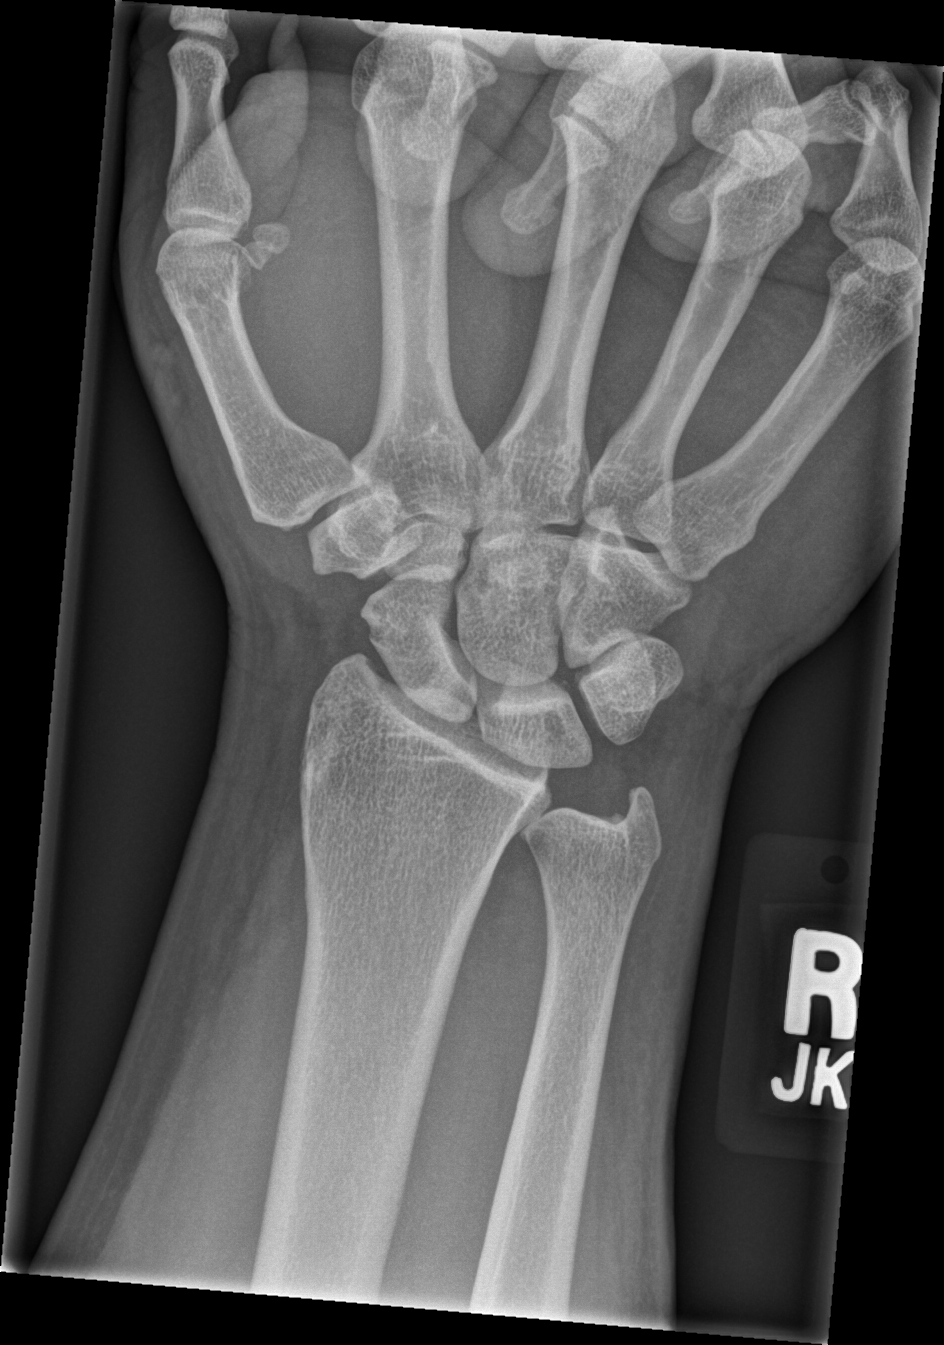

[x wrist obl right]
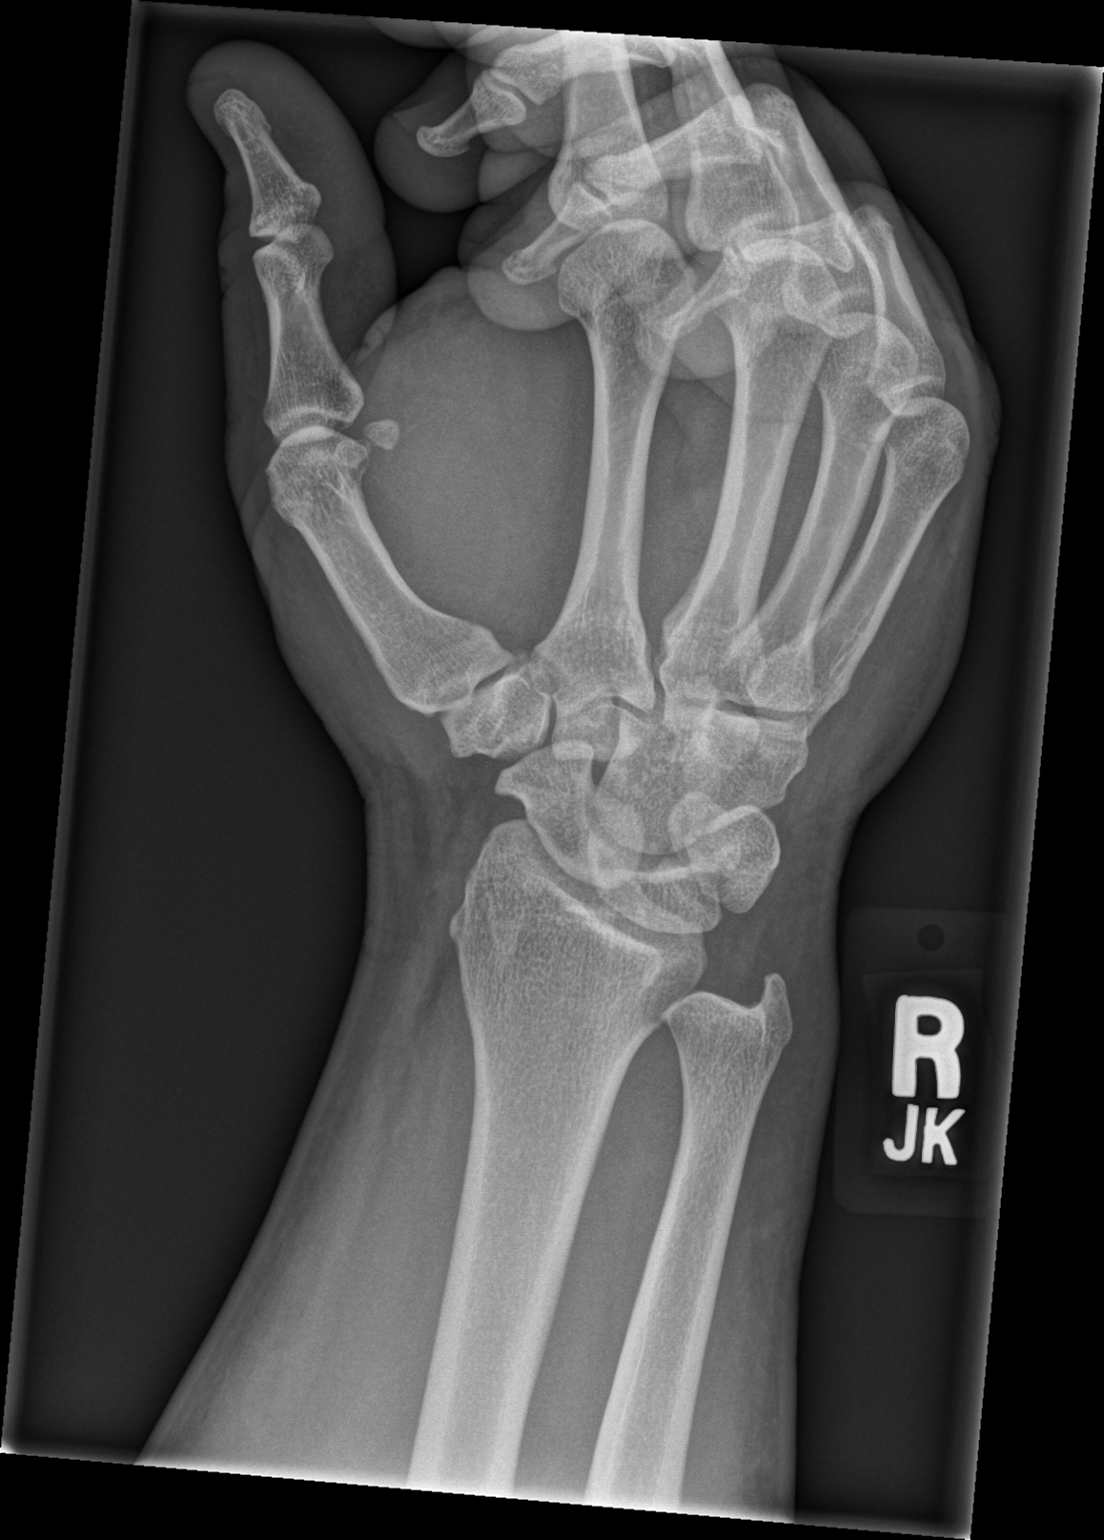

[x wrist lat right (1 of 2)]
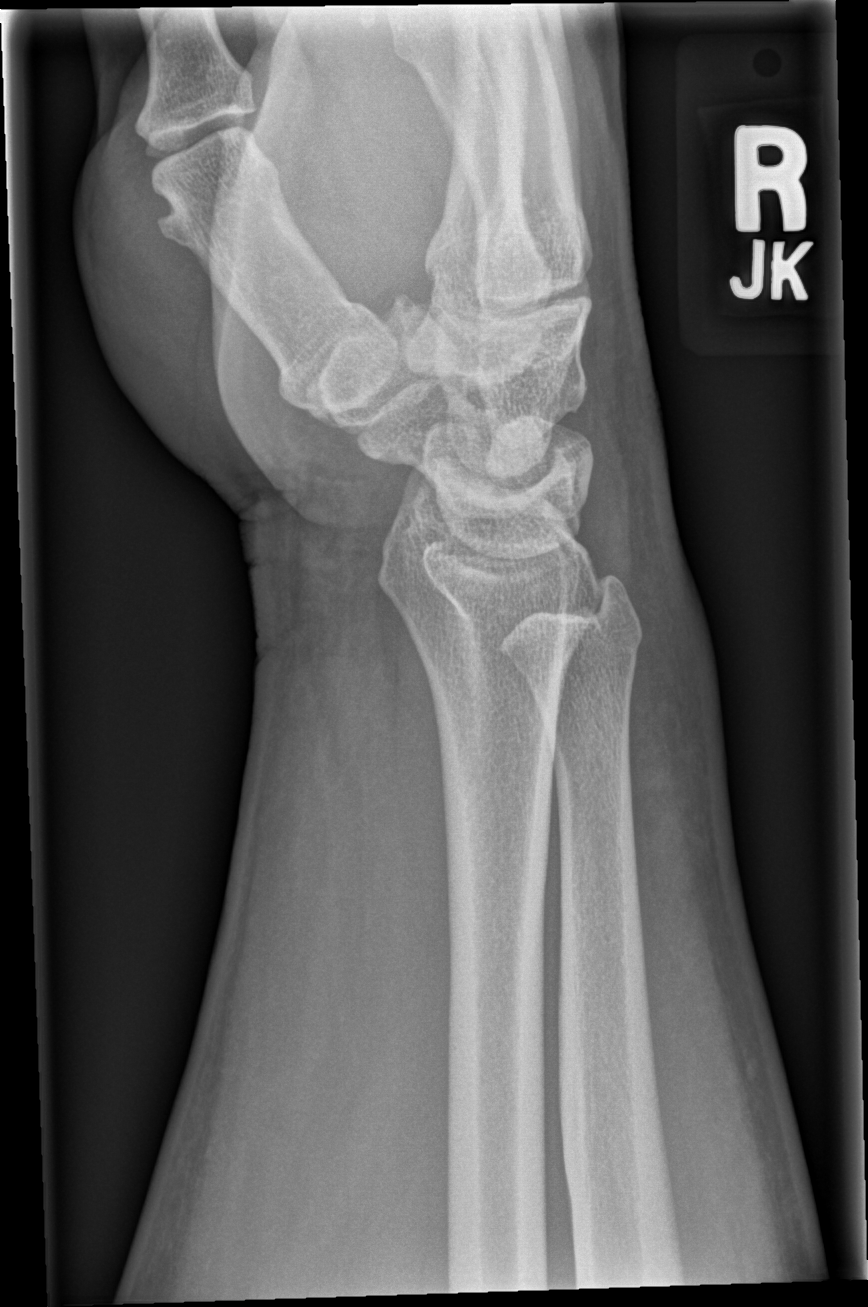

[x wrist lat right (2 of 2)]
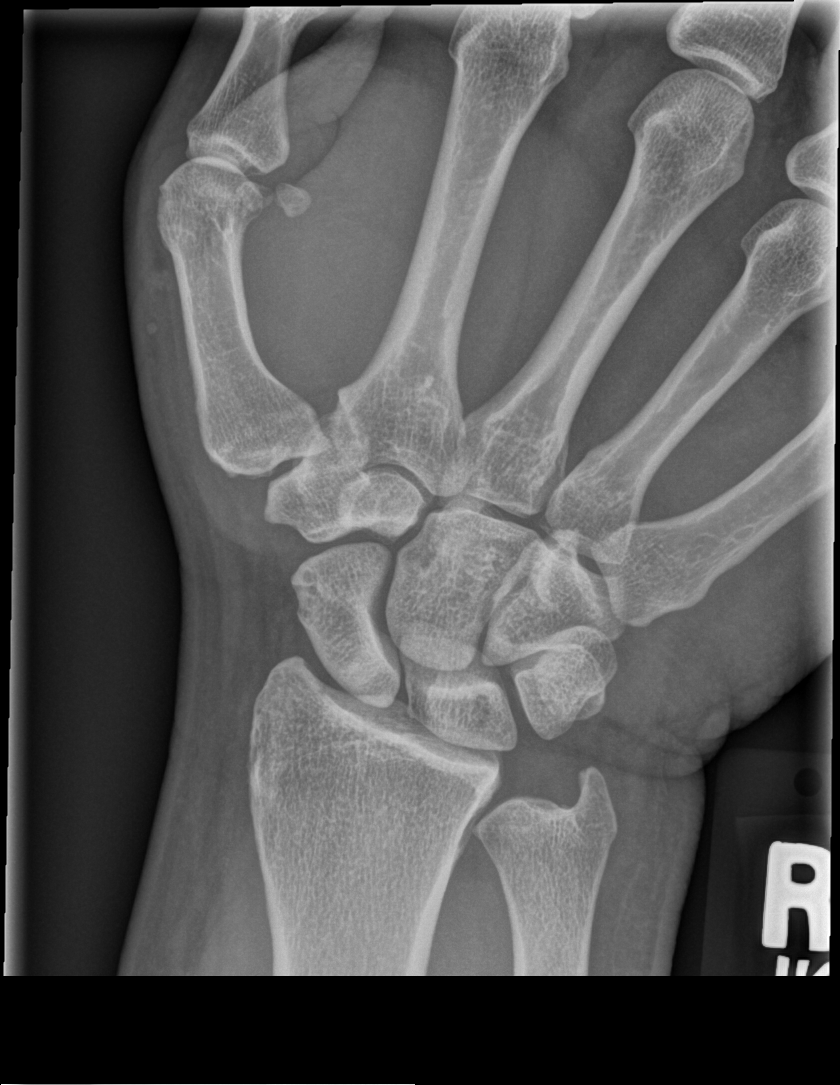

[4 of 4 positions shown; findings below may reference images not displayed]

FINDINGS: Frontal, oblique, lateral, and ulnar deviation scaphoid images were
obtained. There is no evident fracture or dislocation. Joint spaces
appear normal. No erosive change.
IMPRESSION: No fracture or dislocation.  No evident arthropathy.

## 2021-11-18 IMAGING — CR DG SHOULDER 2+V*L*
3 series · 3 of 3 positions shown · non-contrast
Comparison: None.

CLINICAL DATA: Pain

EXAM:
LEFT SHOULDER - 2+ VIEW

[w shoulder grashey left]
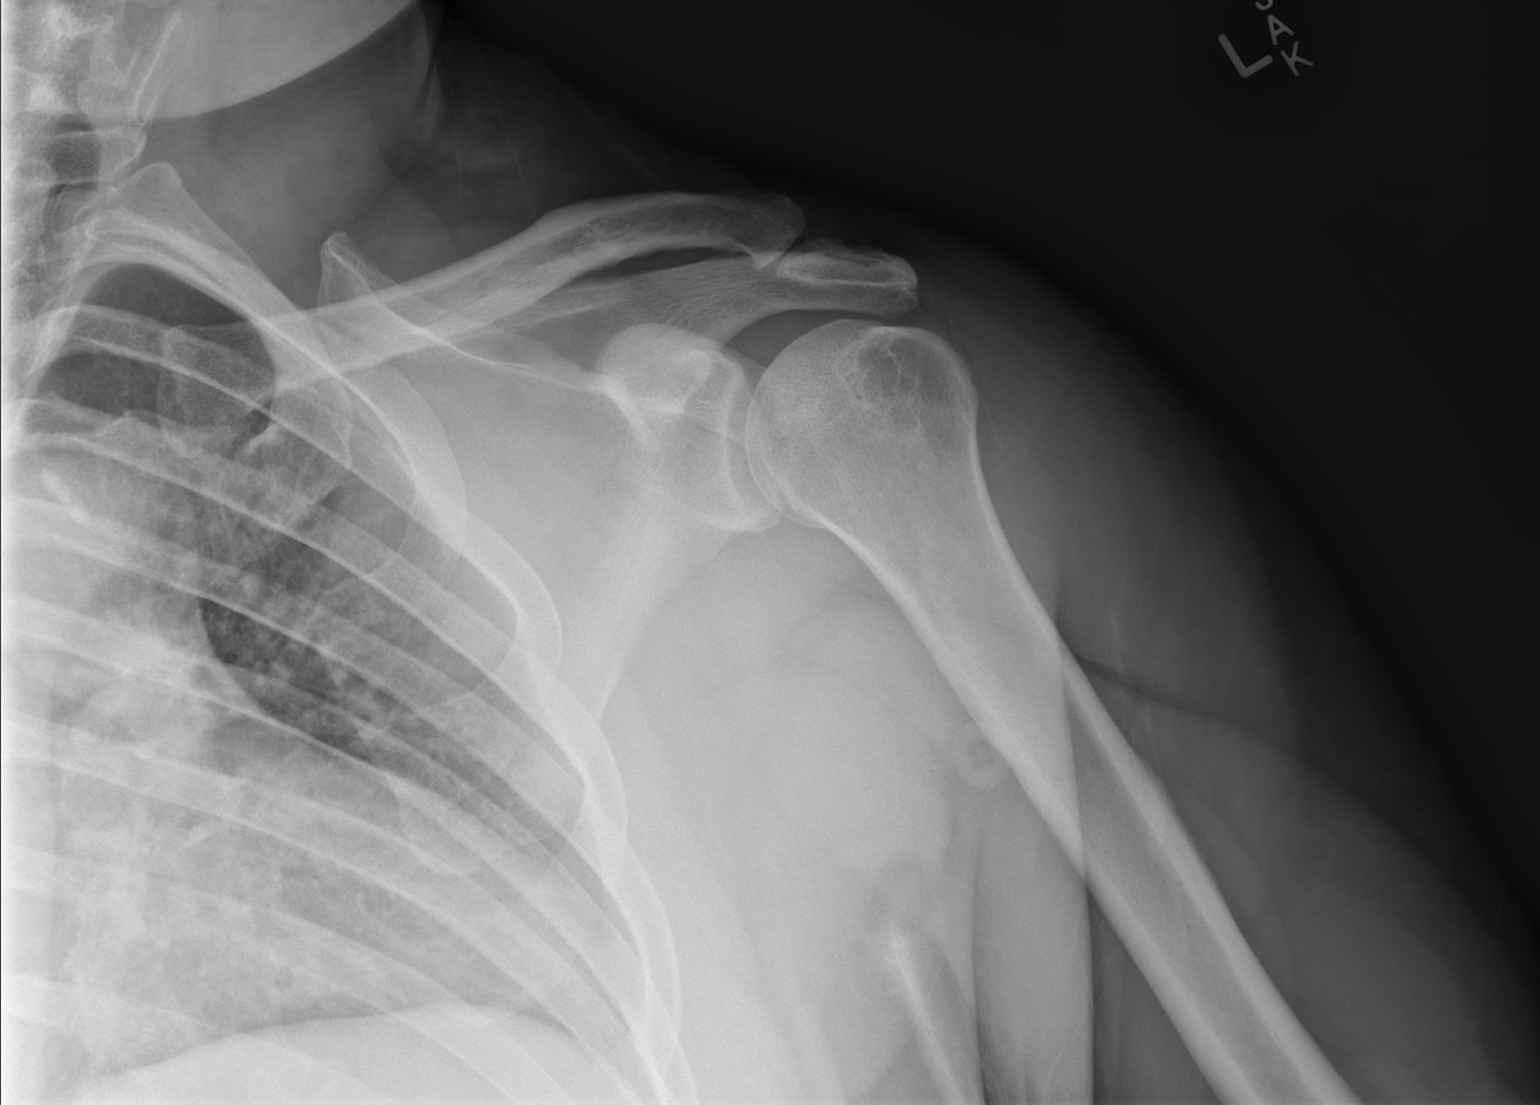

[w shoulder y-view left]
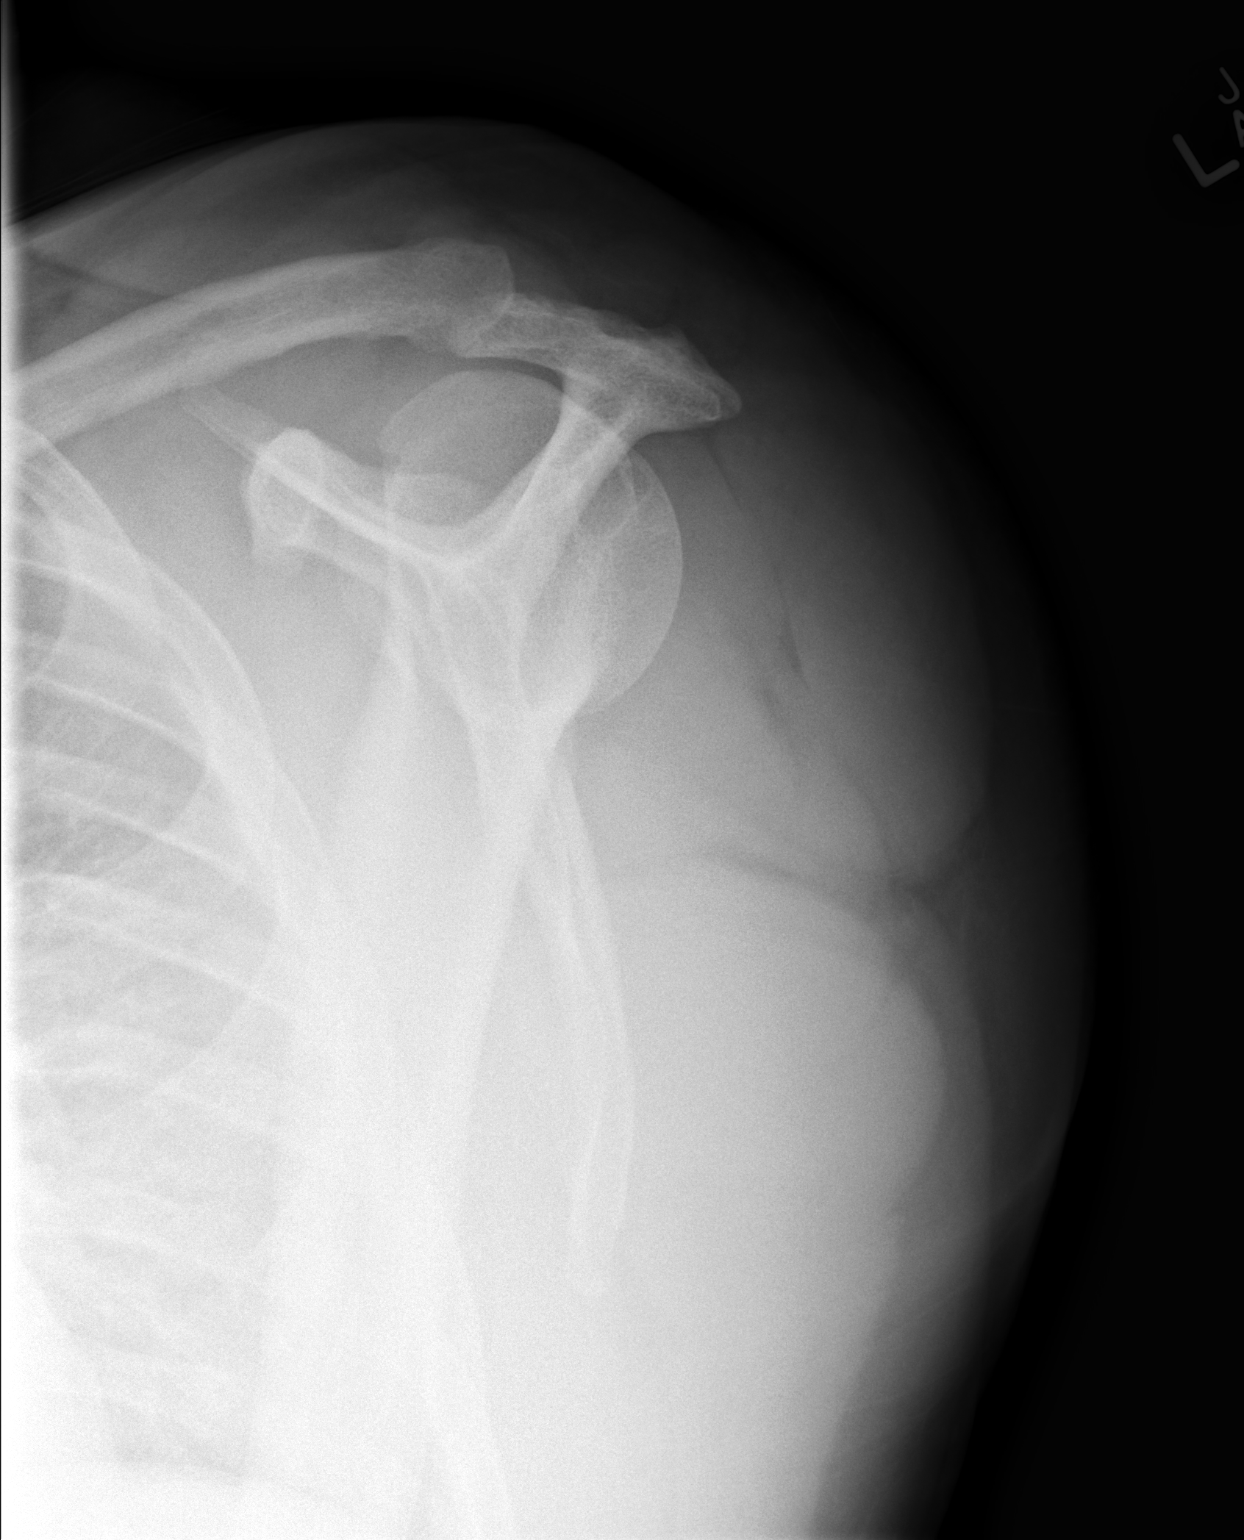

[w shoulder axillary left]
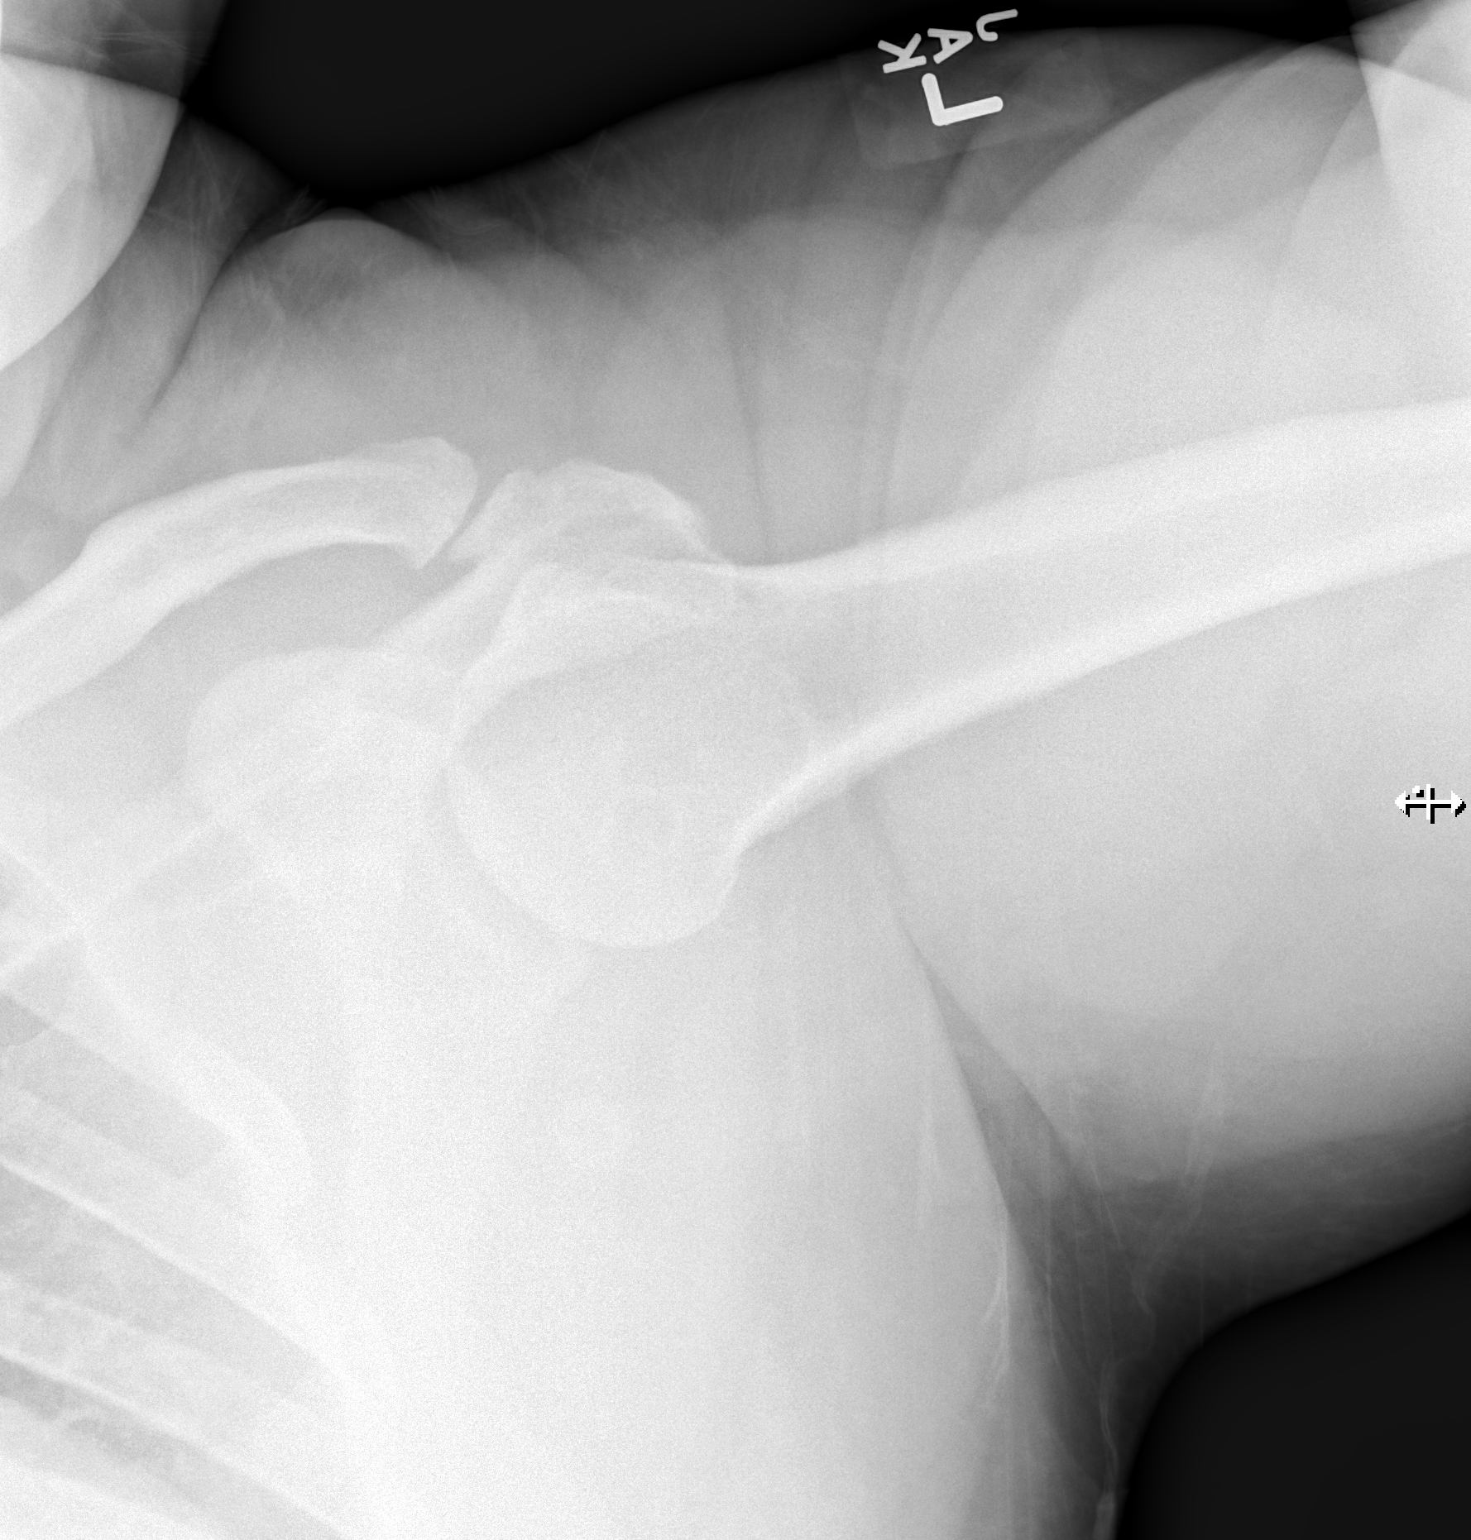

[3 of 3 positions shown; findings below may reference images not displayed]

FINDINGS: Frontal, Y scapular, and axillary images were obtained. No fracture
or dislocation. There is mild joint space narrowing in the
acromioclavicular joint. The glenohumeral joint appears normal. No
erosive change or intra-articular calcification. Visualized left
lung clear.
IMPRESSION: Mild narrowing acromioclavicular joint. Glenohumeral joint space
within normal limits. No fracture or dislocation.
